# Patient Record
Sex: Male | Born: 1960 | Hispanic: No | Marital: Married | Smoking: Never smoker
Health system: Southern US, Community
[De-identification: ages and names within clinical notes are randomized; demographics above are authoritative.]

## PROBLEM LIST (undated history)

## (undated) DIAGNOSIS — I872 Venous insufficiency (chronic) (peripheral): Secondary | ICD-10-CM

## (undated) DIAGNOSIS — K5792 Diverticulitis of intestine, part unspecified, without perforation or abscess without bleeding: Secondary | ICD-10-CM

## (undated) DIAGNOSIS — I10 Essential (primary) hypertension: Secondary | ICD-10-CM

## (undated) DIAGNOSIS — H35033 Hypertensive retinopathy, bilateral: Secondary | ICD-10-CM

## (undated) DIAGNOSIS — H409 Unspecified glaucoma: Secondary | ICD-10-CM

## (undated) DIAGNOSIS — M19011 Primary osteoarthritis, right shoulder: Secondary | ICD-10-CM

## (undated) DIAGNOSIS — E113299 Type 2 diabetes mellitus with mild nonproliferative diabetic retinopathy without macular edema, unspecified eye: Secondary | ICD-10-CM

## (undated) DIAGNOSIS — F39 Unspecified mood [affective] disorder: Secondary | ICD-10-CM

## (undated) DIAGNOSIS — E781 Pure hyperglyceridemia: Secondary | ICD-10-CM

## (undated) DIAGNOSIS — E669 Obesity, unspecified: Secondary | ICD-10-CM

## (undated) DIAGNOSIS — H269 Unspecified cataract: Secondary | ICD-10-CM

## (undated) DIAGNOSIS — K5790 Diverticulosis of intestine, part unspecified, without perforation or abscess without bleeding: Secondary | ICD-10-CM

## (undated) DIAGNOSIS — F411 Generalized anxiety disorder: Secondary | ICD-10-CM

## (undated) DIAGNOSIS — M7581 Other shoulder lesions, right shoulder: Secondary | ICD-10-CM

## (undated) DIAGNOSIS — E66811 Obesity, class 1: Secondary | ICD-10-CM

## (undated) HISTORY — DX: Type 2 diabetes mellitus with mild nonproliferative diabetic retinopathy without macular edema, unspecified eye: E11.3299

## (undated) HISTORY — PX: COLONOSCOPY: SHX174

## (undated) HISTORY — DX: Obesity, class 1: E66.811

## (undated) HISTORY — DX: Pure hyperglyceridemia: E78.1

## (undated) HISTORY — DX: Unspecified glaucoma: H40.9

## (undated) HISTORY — DX: Diverticulitis of intestine, part unspecified, without perforation or abscess without bleeding: K57.92

## (undated) HISTORY — DX: Unspecified cataract: H26.9

## (undated) HISTORY — DX: Diverticulosis of intestine, part unspecified, without perforation or abscess without bleeding: K57.90

## (undated) HISTORY — DX: Primary osteoarthritis, right shoulder: M19.011

## (undated) HISTORY — DX: Essential (primary) hypertension: I10

## (undated) HISTORY — DX: Generalized anxiety disorder: F41.1

## (undated) HISTORY — DX: Unspecified mood (affective) disorder: F39

## (undated) HISTORY — DX: Obesity, unspecified: E66.9

## (undated) HISTORY — DX: Hypertensive retinopathy, bilateral: H35.033

## (undated) HISTORY — DX: Other shoulder lesions, right shoulder: M75.81

## (undated) HISTORY — DX: Venous insufficiency (chronic) (peripheral): I87.2

## (undated) HISTORY — PX: HERNIA REPAIR: SHX51

---
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
## 1983-02-05 HISTORY — PX: ANTERIOR CRUCIATE LIGAMENT REPAIR: SHX115

## 1997-02-04 HISTORY — PX: LUMBAR DISC SURGERY: SHX700

## 2009-02-04 HISTORY — PX: COLONOSCOPY: SHX174

## 2009-05-05 LAB — HM COLONOSCOPY

## 2012-09-25 ENCOUNTER — Ambulatory Visit: Payer: Self-pay | Admitting: Internal Medicine

## 2012-09-28 ENCOUNTER — Ambulatory Visit (INDEPENDENT_AMBULATORY_CARE_PROVIDER_SITE_OTHER): Payer: Federal, State, Local not specified - PPO | Admitting: Family Medicine

## 2012-09-28 ENCOUNTER — Encounter: Payer: Self-pay | Admitting: Family Medicine

## 2012-09-28 VITALS — BP 164/98 | HR 87 | Temp 99.1°F | Resp 16 | Ht 79.0 in | Wt 275.0 lb

## 2012-09-28 DIAGNOSIS — I10 Essential (primary) hypertension: Secondary | ICD-10-CM | POA: Insufficient documentation

## 2012-09-28 DIAGNOSIS — E119 Type 2 diabetes mellitus without complications: Secondary | ICD-10-CM

## 2012-09-28 DIAGNOSIS — E118 Type 2 diabetes mellitus with unspecified complications: Secondary | ICD-10-CM | POA: Insufficient documentation

## 2012-09-28 LAB — BASIC METABOLIC PANEL
CO2: 28 mEq/L (ref 19–32)
Chloride: 102 mEq/L (ref 96–112)
Creatinine, Ser: 0.9 mg/dL (ref 0.4–1.5)
Potassium: 4.2 mEq/L (ref 3.5–5.1)

## 2012-09-28 LAB — HEMOGLOBIN A1C: Hgb A1c MFr Bld: 6.6 % — ABNORMAL HIGH (ref 4.6–6.5)

## 2012-09-28 LAB — TSH: TSH: 0.52 u[IU]/mL (ref 0.35–5.50)

## 2012-09-28 MED ORDER — METOPROLOL SUCCINATE ER 25 MG PO TB24: 25.0000 mg | ORAL_TABLET | Freq: Every day | ORAL | Status: DC

## 2012-09-28 NOTE — Assessment & Plan Note (Addendum)
Add toprol XL 25mg  qd.  Therapeutic expectations and side effect profile of medication discussed today.  Patient's questions answered. Continue amlodipine 10mg  qd and irbesartan 300 mg qd. Check BMET today. Buy bp cuff and monitor at home 1-2 times a day until next f/u in 10d.

## 2012-09-28 NOTE — Assessment & Plan Note (Signed)
Check HbA1c today. This will be his first A1c since getting started on metformin per pt report.

## 2012-09-28 NOTE — Patient Instructions (Signed)
Buy electronic BP cuff (upper arm cuff) and check bp 1-2 times per day.

## 2012-09-28 NOTE — Progress Notes (Signed)
Office Note 09/28/2012  CC:  Chief Complaint  Patient presents with  . Establish Care  . Hypertension    HPI:  Brett Cruz is a 52 y.o. White male who is here to est care, discuss HTN. Patient's most recent primary MD: Casilda Carls, Desiree Hane. Old records were not reviewed prior to or during today's visit.  Recent elevated bp while in [place] visiting, had HA, felt tired, had some intermittent CP's : went to ED where he "got it all checked out" --describes cardiac enzymes, head CT, etc-normal. Amlodipine 10 mg added to his irbisartan 300mg  qd he was already on and he feels a moderate amont better--still some band of HA pain across forehead and extending down behind left ear to occipital area.   He was rx'd fioricet by the ED for HA treatment and says he has used it only a couple of times and thinks it helped when he did take it.   No blurry vision, no focal or generalized weakness, no chest pain, no SOB, no palpitations, no swelling of extremities, no PND or orthopnea.   Past Medical History  Diagnosis Date  . Hypertension approx 2010  . Diabetes mellitus without complication 2013/14  . Diverticulitis   . High triglycerides     Past Surgical History  Procedure Laterality Date  . Back surgery  1999  . Anterior cruciate ligament repair  1985  . Tonsilectomy, adenoidectomy, bilateral myringotomy and tubes  1966    Family History  Problem Relation Age of Onset  . Cancer Mother   . Cancer Father   . Heart disease Father   . Heart disease Brother     History   Social History  . Marital Status: Married    Spouse Name: N/A    Number of Children: N/A  . Years of Education: N/A   Occupational History  . Not on file.   Social History Main Topics  . Smoking status: Never Smoker   . Smokeless tobacco: Never Used  . Alcohol Use: Yes     Comment: socially  . Drug Use: No  . Sexual Activity: Not on file   Other Topics Concern  . Not on file   Social History Narrative    Married, 2 college age sons.   Orig from [place].   Relocated to [facility] [place].   Occupation: Airline pilot for [employer].   BA from [employer].   No tobacco or drugs.  Alcohol: occasional.    Outpatient Encounter Prescriptions as of 09/28/2012  Medication Sig Dispense Refill  . amLODipine (NORVASC) 10 MG tablet Take 10 mg by mouth daily.       . butalbital-acetaminophen-caffeine (FIORICET, ESGIC) 50-325-40 MG per tablet Take 2 tablets by mouth every 6 (six) hours as needed.       . fenofibrate (TRICOR) 145 MG tablet Take 145 mg by mouth daily.       . irbesartan (AVAPRO) 300 MG tablet Take 300 mg by mouth daily.       . metFORMIN (GLUCOPHAGE-XR) 500 MG 24 hr tablet Take 1,000 mg by mouth daily with breakfast.       . metoprolol succinate (TOPROL-XL) 25 MG 24 hr tablet Take 1 tablet (25 mg total) by mouth daily.  30 tablet  1   No facility-administered encounter medications on file as of 09/28/2012.    No Known Allergies  ROS See HPI PE; Blood pressure 164/98, pulse 87, temperature 99.1 F (37.3 C), temperature source Temporal, resp. rate 16, height  6\' 7"  (2.007 m), weight 275 lb (124.739 kg), SpO2 97.00%. Gen: Alert, well appearing.  Patient is oriented to person, place, time, and situation. AFFECT: pleasant, lucid thought and speech. ENT:  Eyes: no injection, icteris, swelling, or exudate.  EOMI, PERRLA. Nose: no drainage or turbinate edema/swelling.  No injection or focal lesion.  Mouth: lips without lesion/swelling.  Oral mucosa pink and moist.  Oropharynx without erythema, exudate, or swelling.  Neck: supple/nontender.  No LAD, mass, or TM.  Carotid pulses 2+ bilaterally, without bruits. CV: RRR, no m/r/g.   LUNGS: CTA bilat, nonlabored resps, good aeration in all lung fields. ABD: soft, NT, ND, BS normal.  No hepatospenomegaly or mass.  No bruits. EXT: no clubbing, cyanosis, or edema.   Pertinent labs:  None today  ASSESSMENT AND PLAN:   New pt: obtain old  records.  Uncontrolled hypertension Add toprol XL 25mg  qd.  Therapeutic expectations and side effect profile of medication discussed today.  Patient's questions answered. Continue amlodipine 10mg  qd and irbesartan 300 mg qd. Check BMET today. Buy bp cuff and monitor at home 1-2 times a day until next f/u in 10d.  Type II or unspecified type diabetes mellitus without mention of complication, not stated as uncontrolled Check HbA1c today. This will be his first A1c since getting started on metformin per pt report.   An After Visit Summary was printed and given to the patient.  Return in about 10 days (around 10/08/2012) for 30 min f/u for HTN.

## 2012-10-08 ENCOUNTER — Ambulatory Visit (INDEPENDENT_AMBULATORY_CARE_PROVIDER_SITE_OTHER): Payer: Federal, State, Local not specified - PPO | Admitting: Family Medicine

## 2012-10-08 ENCOUNTER — Encounter: Payer: Self-pay | Admitting: Family Medicine

## 2012-10-08 VITALS — BP 134/88 | HR 70 | Temp 98.8°F | Resp 16 | Ht 79.0 in | Wt 278.0 lb

## 2012-10-08 DIAGNOSIS — I10 Essential (primary) hypertension: Secondary | ICD-10-CM

## 2012-10-08 DIAGNOSIS — E119 Type 2 diabetes mellitus without complications: Secondary | ICD-10-CM

## 2012-10-08 NOTE — Progress Notes (Signed)
OFFICE NOTE  10/08/2012  CC:  Chief Complaint  Patient presents with  . Hypertension     HPI: Patient is a 52 y.o. Caucasian male who is here for 10 day f/u for uncontrolled HTN. Started him on 25mg  toprol XL last visit.  Labs showed A1c <7%, normal BMET.  He is no longer having any HAs.  Denies any side effect from toprol or any other of his meds.  Pertinent PMH:  Past Medical History  Diagnosis Date  . Hypertension approx 2010  . Diabetes mellitus without complication 2013/14  . Diverticulitis   . High triglycerides    Past surgical, social, and family history reviewed and no changes noted since last office visit.  MEDS:  Outpatient Prescriptions Prior to Visit  Medication Sig Dispense Refill  . amLODipine (NORVASC) 10 MG tablet Take 10 mg by mouth daily.       . fenofibrate (TRICOR) 145 MG tablet Take 145 mg by mouth daily.       . irbesartan (AVAPRO) 300 MG tablet Take 300 mg by mouth daily.       . metFORMIN (GLUCOPHAGE-XR) 500 MG 24 hr tablet Take 1,000 mg by mouth daily with breakfast.       . metoprolol succinate (TOPROL-XL) 25 MG 24 hr tablet Take 1 tablet (25 mg total) by mouth daily.  30 tablet  1  . butalbital-acetaminophen-caffeine (FIORICET, ESGIC) 50-325-40 MG per tablet Take 2 tablets by mouth every 6 (six) hours as needed.        No facility-administered medications prior to visit.    PE: Blood pressure 134/88, pulse 70, temperature 98.8 F (37.1 C), temperature source Temporal, resp. rate 16, height 6\' 7"  (2.007 m), weight 278 lb (126.1 kg), SpO2 97.00%. Gen: Alert, well appearing.  Patient is oriented to person, place, time, and situation. CV: RRR, no m/r/g.   LUNGS: CTA bilat, nonlabored resps, good aeration in all lung fields. EXT: trace pitting edema in right LL, 1+ pitting edema in right LL  IMPRESSION AND PLAN:  Uncontrolled hypertension Now under control. Continue current meds and home  bp monitoring.  Type II or unspecified type diabetes  mellitus without mention of complication, not stated as uncontrolled Good control. Lab Results  Component Value Date   HGBA1C 6.6* 09/28/2012   Continue current med and diet.   This is a new patient, 2nd visit today, still haven't received old records.  He declined flu vaccine today.  FOLLOW UP: 4 mo-fasting

## 2012-10-08 NOTE — Assessment & Plan Note (Signed)
Now under control. Continue current meds and home  bp monitoring.

## 2012-10-08 NOTE — Assessment & Plan Note (Signed)
Good control. Lab Results  Component Value Date   HGBA1C 6.6* 09/28/2012   Continue current med and diet.

## 2012-10-14 ENCOUNTER — Telehealth: Payer: Self-pay | Admitting: Family Medicine

## 2012-10-14 MED ORDER — AMLODIPINE BESYLATE 10 MG PO TABS: 10.0000 mg | ORAL_TABLET | Freq: Every day | ORAL | Status: DC

## 2012-10-14 MED ORDER — METOPROLOL SUCCINATE ER 25 MG PO TB24: 25.0000 mg | ORAL_TABLET | Freq: Every day | ORAL | Status: DC

## 2012-10-14 NOTE — Telephone Encounter (Signed)
Patient's wife, Darel Hong called asking for rx refills of metoprolol and amlodipine for patient to be sent to [facility], [place].

## 2012-11-03 ENCOUNTER — Encounter: Payer: Self-pay | Admitting: Family Medicine

## 2012-11-03 DIAGNOSIS — Z125 Encounter for screening for malignant neoplasm of prostate: Secondary | ICD-10-CM | POA: Insufficient documentation

## 2012-11-03 DIAGNOSIS — Z1211 Encounter for screening for malignant neoplasm of colon: Secondary | ICD-10-CM | POA: Insufficient documentation

## 2013-01-12 ENCOUNTER — Ambulatory Visit (INDEPENDENT_AMBULATORY_CARE_PROVIDER_SITE_OTHER): Payer: Federal, State, Local not specified - PPO | Admitting: Family Medicine

## 2013-01-12 ENCOUNTER — Ambulatory Visit (HOSPITAL_BASED_OUTPATIENT_CLINIC_OR_DEPARTMENT_OTHER)
Admission: RE | Admit: 2013-01-12 | Discharge: 2013-01-12 | Disposition: A | Discharge status: Home / Self Care | Payer: Federal, State, Local not specified - PPO | Source: Ambulatory Visit | Attending: Family Medicine | Admitting: Family Medicine

## 2013-01-12 ENCOUNTER — Encounter: Payer: Self-pay | Admitting: Family Medicine

## 2013-01-12 VITALS — BP 150/91 | HR 75 | Temp 97.6°F | Resp 18 | Ht 79.0 in | Wt 292.0 lb

## 2013-01-12 DIAGNOSIS — M79609 Pain in unspecified limb: Secondary | ICD-10-CM

## 2013-01-12 DIAGNOSIS — M7989 Other specified soft tissue disorders: Secondary | ICD-10-CM | POA: Insufficient documentation

## 2013-01-12 DIAGNOSIS — I872 Venous insufficiency (chronic) (peripheral): Secondary | ICD-10-CM

## 2013-01-12 DIAGNOSIS — R609 Edema, unspecified: Secondary | ICD-10-CM

## 2013-01-12 NOTE — Progress Notes (Signed)
Pre-visit discussion using our clinic review tool. No additional management support is needed unless otherwise documented below in the visit note.  OFFICE NOTE  01/12/2013  CC:  Chief Complaint  Patient presents with  . Leg Swelling    mainly Left leg.   . Leg Pain     HPI: Patient is a 52 y.o. Caucasian male who is here for acute visit for leg swelling. Onset about 3 wks ago of lower leg pain--anteriorly under patella and also in calf---left>right.  The pain is intermittent, worse with wt bearing but at times it is fine with wt bearing.  No knee swelling. Swelling worse towards the end of his day.  He admits to eating quite a bit more liberally lately. No known FH of blood clot. No recent prolonged immobilization, no recent surgical procedures.  +Hx of back pain but has not had radicular pain similar to his current pain before.   Pertinent PMH:  Past Medical History  Diagnosis Date  . Hypertension approx 2010  . Diabetes mellitus without complication 2013/14  . Diverticulitis   . High triglycerides   . Mood disorder     dep/anx  . Obesity, Class I, BMI 30-34.9     MEDS:  Outpatient Prescriptions Prior to Visit  Medication Sig Dispense Refill  . amLODipine (NORVASC) 10 MG tablet Take 1 tablet (10 mg total) by mouth daily.  30 tablet  3  . fenofibrate (TRICOR) 145 MG tablet Take 145 mg by mouth daily.       . irbesartan (AVAPRO) 300 MG tablet Take 300 mg by mouth daily.       . metFORMIN (GLUCOPHAGE-XR) 500 MG 24 hr tablet Take 1,000 mg by mouth daily with breakfast.       . metoprolol succinate (TOPROL-XL) 25 MG 24 hr tablet Take 1 tablet (25 mg total) by mouth daily.  30 tablet  3  . butalbital-acetaminophen-caffeine (FIORICET, ESGIC) 50-325-40 MG per tablet Take 2 tablets by mouth every 6 (six) hours as needed.        No facility-administered medications prior to visit.    PE: Blood pressure 150/91, pulse 75, temperature 97.6 F (36.4 C), temperature source  Temporal, resp. rate 18, height 6\' 7"  (2.007 m), weight 292 lb (132.45 kg), SpO2 96.00%. Gen: Alert, well appearing.  Patient is oriented to person, place, time, and situation. CV: RRR, no m/r/g.   LUNGS: CTA bilat, nonlabored resps, good aeration in all lung fields. EXT: no cyanosis or clubbing. He has 2+ pitting edema in both LE's from the knees down into ankles/feet. No knee warmth or swelling or redness.  LE strength 5/5 bilat.  DTRs trace in patellar and achilles region bilat.  Sensation intact.  NO calf tenderness or cord. Calf cir 15 cm below patella is 43 1/2 cm on left and 44 cm on right.  IMPRESSION AND PLAN:  Bilat LE edema, L>R.  With some L>R pain assoc with this. Will r/o DVT with u/s today. Discussed likelihood of Na over-ingestion lately with "holiday" eating and pt expressed understanding. Gave pt low Na diet handout.  FOLLOW UP: To be determined based on results of pending workup.

## 2013-01-18 ENCOUNTER — Telehealth: Payer: Self-pay | Admitting: Family Medicine

## 2013-01-18 MED ORDER — IRBESARTAN 300 MG PO TABS: 300.0000 mg | ORAL_TABLET | Freq: Every day | ORAL | Status: DC

## 2013-01-18 NOTE — Telephone Encounter (Signed)
Medication sent to pharmacy  

## 2013-02-09 ENCOUNTER — Encounter: Payer: Self-pay | Admitting: Family Medicine

## 2013-02-09 ENCOUNTER — Ambulatory Visit (INDEPENDENT_AMBULATORY_CARE_PROVIDER_SITE_OTHER): Payer: Federal, State, Local not specified - PPO | Admitting: Family Medicine

## 2013-02-09 VITALS — BP 142/88 | HR 69 | Temp 98.1°F | Resp 18 | Ht 79.0 in | Wt 286.0 lb

## 2013-02-09 DIAGNOSIS — E781 Pure hyperglyceridemia: Secondary | ICD-10-CM | POA: Insufficient documentation

## 2013-02-09 DIAGNOSIS — I10 Essential (primary) hypertension: Secondary | ICD-10-CM

## 2013-02-09 DIAGNOSIS — I872 Venous insufficiency (chronic) (peripheral): Secondary | ICD-10-CM

## 2013-02-09 DIAGNOSIS — Z23 Encounter for immunization: Secondary | ICD-10-CM

## 2013-02-09 DIAGNOSIS — E119 Type 2 diabetes mellitus without complications: Secondary | ICD-10-CM

## 2013-02-09 MED ORDER — METFORMIN HCL ER 500 MG PO TB24: 1000.0000 mg | ORAL_TABLET | Freq: Every day | ORAL | Status: DC

## 2013-02-09 MED ORDER — IRBESARTAN 300 MG PO TABS: 300.0000 mg | ORAL_TABLET | Freq: Every day | ORAL | Status: DC

## 2013-02-09 MED ORDER — FENOFIBRATE 145 MG PO TABS: 145.0000 mg | ORAL_TABLET | Freq: Every day | ORAL | Status: DC

## 2013-02-09 MED ORDER — METOPROLOL SUCCINATE ER 25 MG PO TB24: 25.0000 mg | ORAL_TABLET | Freq: Every day | ORAL | Status: DC

## 2013-02-09 MED ORDER — AMLODIPINE BESYLATE 10 MG PO TABS: 10.0000 mg | ORAL_TABLET | Freq: Every day | ORAL | Status: DC

## 2013-02-09 NOTE — Assessment & Plan Note (Signed)
Improving with low Na diet.

## 2013-02-09 NOTE — Assessment & Plan Note (Addendum)
No home monitoring: encouraged pt again to get glucometer, bring it in for nurse visit for teaching. Diet ok. Foot exam normal today. Pneumovax today.  He declined flu vaccine. Labs + urine microalb/cr next f/u in 4 mo.

## 2013-02-09 NOTE — Assessment & Plan Note (Signed)
Compliant with meds, diet ok. Recheck labs next f/u in 4 mo.

## 2013-02-09 NOTE — Assessment & Plan Note (Signed)
Control much improved. The current medical regimen is effective;  continue present plan and medications.

## 2013-02-09 NOTE — Progress Notes (Signed)
OFFICE NOTE  02/09/2013  CC:  Chief Complaint  Patient presents with  . Follow-up     HPI: Patient is a 53 y.o. Caucasian male who is here for 4 mo f/u DM2, HTN, hypertriglyceridemia. Compliant with all chronic meds.  No polyuria or polydipsia. No burning, tingling, or numbness in feet.  LE swelling better lately, cutting back on salt.  Not checking glucoses lately--hasn't bought a machine. BP numbers normal at home, esp when he took a day off recently.  Diet good for long stretches and then a stretch of poorer diet during holidays. He prefers to wait and recheck labs next f/u if that's ok w/me. Last lipid panel 06/2012 stable.   Pertinent PMH:  Past Medical History  Diagnosis Date  . Hypertension approx 2010  . Diabetes mellitus without complication 6712/45  . Diverticulitis   . High triglycerides   . Mood disorder     dep/anx  . Obesity, Class I, BMI 30-34.9    Past surgical, social, and family history reviewed and no changes noted since last office visit.  MEDS:  Outpatient Prescriptions Prior to Visit  Medication Sig Dispense Refill  . amLODipine (NORVASC) 10 MG tablet Take 1 tablet (10 mg total) by mouth daily.  30 tablet  3  . fenofibrate (TRICOR) 145 MG tablet Take 145 mg by mouth daily.       . irbesartan (AVAPRO) 300 MG tablet Take 1 tablet (300 mg total) by mouth daily.  30 tablet  3  . metFORMIN (GLUCOPHAGE-XR) 500 MG 24 hr tablet Take 1,000 mg by mouth daily with breakfast.       . metoprolol succinate (TOPROL-XL) 25 MG 24 hr tablet Take 1 tablet (25 mg total) by mouth daily.  30 tablet  3  . butalbital-acetaminophen-caffeine (FIORICET, ESGIC) 50-325-40 MG per tablet Take 2 tablets by mouth every 6 (six) hours as needed.        No facility-administered medications prior to visit.    PE: Blood pressure 142/88, pulse 69, temperature 98.1 F (36.7 C), temperature source Temporal, resp. rate 18, height 6\' 7"  (2.007 m), weight 286 lb (129.729 kg), SpO2  99.00%. Gen: Alert, well appearing.  Patient is oriented to person, place, time, and situation. Foot exam - both normal; no swelling, tenderness or skin or vascular lesions. Color and temperature is normal. Sensation is intact. Peripheral pulses are palpable. Toenails are normal. LEGS: pretibial pitting edema 1+ bilat   IMPRESSION AND PLAN:  Type II or unspecified type diabetes mellitus without mention of complication, not stated as uncontrolled No home monitoring: encouraged pt again to get glucometer, bring it in for nurse visit for teaching. Diet ok. Foot exam normal today. Pneumovax today.  He declined flu vaccine. Labs + urine microalb/cr next f/u in 4 mo.  Uncontrolled hypertension Control much improved. The current medical regimen is effective;  continue present plan and medications.   Chronic venous insufficiency Improving with low Na diet.  Hypertriglyceridemia Compliant with meds, diet ok. Recheck labs next f/u in 4 mo.   An After Visit Summary was printed and given to the patient.  FOLLOW UP: 25mo

## 2013-02-09 NOTE — Progress Notes (Signed)
Pre visit review using our clinic review tool, if applicable. No additional management support is needed unless otherwise documented below in the visit note. 

## 2013-02-10 ENCOUNTER — Telehealth: Payer: Self-pay

## 2013-02-10 NOTE — Telephone Encounter (Signed)
Relevant patient education mailed to patient.  

## 2013-03-05 ENCOUNTER — Telehealth: Payer: Self-pay | Admitting: Family Medicine

## 2013-03-05 NOTE — Telephone Encounter (Signed)
Relevant patient education mailed to patient.  

## 2013-04-29 ENCOUNTER — Other Ambulatory Visit: Payer: Self-pay

## 2013-04-29 ENCOUNTER — Other Ambulatory Visit: Payer: Self-pay | Admitting: Family Medicine

## 2013-04-29 MED ORDER — GLUCOSE BLOOD VI STRP: ORAL_STRIP | Status: AC

## 2013-04-29 NOTE — Telephone Encounter (Signed)
Pt requesting strips for accu check compact plus b/s meter.  Strips sent to [facility] per pt request.

## 2013-06-10 ENCOUNTER — Ambulatory Visit: Payer: Federal, State, Local not specified - PPO | Admitting: Family Medicine

## 2013-06-15 ENCOUNTER — Other Ambulatory Visit (INDEPENDENT_AMBULATORY_CARE_PROVIDER_SITE_OTHER): Payer: Federal, State, Local not specified - PPO

## 2013-06-15 DIAGNOSIS — Z0000 Encounter for general adult medical examination without abnormal findings: Secondary | ICD-10-CM

## 2013-06-15 LAB — LIPID PANEL
CHOL/HDL RATIO: 4
Cholesterol: 117 mg/dL (ref 0–200)
HDL: 30.5 mg/dL — AB (ref >39.00)
LDL Cholesterol: 53 mg/dL (ref 0–99)
Triglycerides: 168 mg/dL — ABNORMAL HIGH (ref 0.0–149.0)
VLDL: 33.6 mg/dL (ref 0.0–40.0)

## 2013-06-15 LAB — CBC WITH DIFFERENTIAL/PLATELET
BASOS ABS: 0.1 10*3/uL (ref 0.0–0.1)
Basophils Relative: 0.7 % (ref 0.0–3.0)
EOS PCT: 2.5 % (ref 0.0–5.0)
Eosinophils Absolute: 0.2 10*3/uL (ref 0.0–0.7)
HCT: 44 % (ref 39.0–52.0)
Hemoglobin: 15 g/dL (ref 13.0–17.0)
LYMPHS PCT: 40.4 % (ref 12.0–46.0)
Lymphs Abs: 3 10*3/uL (ref 0.7–4.0)
MCHC: 34.1 g/dL (ref 30.0–36.0)
MCV: 91.9 fl (ref 78.0–100.0)
Monocytes Absolute: 0.5 10*3/uL (ref 0.1–1.0)
Monocytes Relative: 7.3 % (ref 3.0–12.0)
Neutro Abs: 3.6 10*3/uL (ref 1.4–7.7)
Neutrophils Relative %: 49.1 % (ref 43.0–77.0)
PLATELETS: 254 10*3/uL (ref 150.0–400.0)
RBC: 4.79 Mil/uL (ref 4.22–5.81)
RDW: 13.1 % (ref 11.5–15.5)
WBC: 7.3 10*3/uL (ref 4.0–10.5)

## 2013-06-15 LAB — COMPREHENSIVE METABOLIC PANEL
ALBUMIN: 4 g/dL (ref 3.5–5.2)
ALT: 36 U/L (ref 0–53)
AST: 27 U/L (ref 0–37)
Alkaline Phosphatase: 61 U/L (ref 39–117)
BUN: 16 mg/dL (ref 6–23)
CALCIUM: 9.4 mg/dL (ref 8.4–10.5)
CHLORIDE: 105 meq/L (ref 96–112)
CO2: 26 meq/L (ref 19–32)
Creatinine, Ser: 0.7 mg/dL (ref 0.4–1.5)
GFR: 126.85 mL/min (ref >60.00)
Glucose, Bld: 146 mg/dL — ABNORMAL HIGH (ref 70–99)
POTASSIUM: 4.1 meq/L (ref 3.5–5.1)
SODIUM: 138 meq/L (ref 135–145)
TOTAL PROTEIN: 6.9 g/dL (ref 6.0–8.3)
Total Bilirubin: 0.5 mg/dL (ref 0.2–1.2)

## 2013-06-15 LAB — PSA: PSA: 0.68 ng/mL (ref 0.10–4.00)

## 2013-06-15 LAB — TSH: TSH: 2.27 u[IU]/mL (ref 0.35–4.50)

## 2013-06-15 LAB — HEMOGLOBIN A1C: HEMOGLOBIN A1C: 7.2 % — AB (ref 4.6–6.5)

## 2013-06-16 ENCOUNTER — Encounter: Payer: Self-pay | Admitting: Family Medicine

## 2013-06-16 ENCOUNTER — Ambulatory Visit (INDEPENDENT_AMBULATORY_CARE_PROVIDER_SITE_OTHER): Payer: Federal, State, Local not specified - PPO | Admitting: Family Medicine

## 2013-06-16 VITALS — BP 137/87 | HR 81 | Temp 97.8°F | Resp 18 | Ht 79.0 in | Wt 288.0 lb

## 2013-06-16 DIAGNOSIS — IMO0001 Reserved for inherently not codable concepts without codable children: Secondary | ICD-10-CM | POA: Insufficient documentation

## 2013-06-16 DIAGNOSIS — Z23 Encounter for immunization: Secondary | ICD-10-CM

## 2013-06-16 DIAGNOSIS — Z135 Encounter for screening for eye and ear disorders: Secondary | ICD-10-CM

## 2013-06-16 DIAGNOSIS — I10 Essential (primary) hypertension: Secondary | ICD-10-CM

## 2013-06-16 DIAGNOSIS — Z0000 Encounter for general adult medical examination without abnormal findings: Secondary | ICD-10-CM | POA: Insufficient documentation

## 2013-06-16 DIAGNOSIS — E1165 Type 2 diabetes mellitus with hyperglycemia: Principal | ICD-10-CM

## 2013-06-16 DIAGNOSIS — E781 Pure hyperglyceridemia: Secondary | ICD-10-CM

## 2013-06-16 DIAGNOSIS — Z1211 Encounter for screening for malignant neoplasm of colon: Secondary | ICD-10-CM

## 2013-06-16 LAB — NICOTINE/COTININE METABOLITES

## 2013-06-16 NOTE — Patient Instructions (Signed)
Check your sugar once daily (do a fasting check every other day alternating with a 2 hour "after meal" check every other day). Record these and bring them to your next visit in 4 mo.  

## 2013-06-16 NOTE — Progress Notes (Signed)
OFFICE NOTE  06/16/2013  CC:  Chief Complaint  Patient presents with  . Follow-up     HPI: Patient is a 53 y.o. Caucasian male who is here for 4 mo f/u DM 2, HTN, hypertriglyceridemia. Has glucometer now but hasn't used it. Pt says he is motivated now to get started with TLC, wants to avoid any changes in meds/additional meds at this time.  Home bp monitoring shows good control per pt. We reviewed all of his recent fasting blood work.  Pertinent PMH:  Past medical, surgical, social, and family history reviewed and no changes are noted since last office visit.  MEDS:  Outpatient Prescriptions Prior to Visit  Medication Sig Dispense Refill  . amLODipine (NORVASC) 10 MG tablet Take 1 tablet (10 mg total) by mouth daily.  90 tablet  1  . butalbital-acetaminophen-caffeine (FIORICET, ESGIC) 50-325-40 MG per tablet Take 2 tablets by mouth every 6 (six) hours as needed.       . fenofibrate (TRICOR) 145 MG tablet Take 1 tablet (145 mg total) by mouth daily.  90 tablet  1  . glucose blood test strip Use as instructed  100 each  12  . irbesartan (AVAPRO) 300 MG tablet Take 1 tablet (300 mg total) by mouth daily.  90 tablet  1  . metFORMIN (GLUCOPHAGE-XR) 500 MG 24 hr tablet Take 2 tablets (1,000 mg total) by mouth daily with breakfast.  180 tablet  1  . metoprolol succinate (TOPROL-XL) 25 MG 24 hr tablet Take 1 tablet (25 mg total) by mouth daily.  90 tablet  1   No facility-administered medications prior to visit.    PE: Blood pressure 137/87, pulse 81, temperature 97.8 F (36.6 C), temperature source Temporal, resp. rate 18, height 6\' 7"  (2.007 m), weight 288 lb (130.636 kg), SpO2 98.00%. Gen: Alert, well appearing.  Patient is oriented to person, place, time, and situation. AFFECT: pleasant, lucid thought and speech. No further exam.  LAB: none today Recent: Lab Results  Component Value Date   TSH 2.27 06/15/2013   Lab Results  Component Value Date   WBC 7.3 06/15/2013   HGB  15.0 06/15/2013   HCT 44.0 06/15/2013   MCV 91.9 06/15/2013   PLT 254.0 06/15/2013   Lab Results  Component Value Date   CREATININE 0.7 06/15/2013   BUN 16 06/15/2013   NA 138 06/15/2013   K 4.1 06/15/2013   CL 105 06/15/2013   CO2 26 06/15/2013   Lab Results  Component Value Date   ALT 36 06/15/2013   AST 27 06/15/2013   ALKPHOS 61 06/15/2013   BILITOT 0.5 06/15/2013   Lab Results  Component Value Date   CHOL 117 06/15/2013   Lab Results  Component Value Date   HDL 30.50* 06/15/2013   Lab Results  Component Value Date   LDLCALC 53 06/15/2013   Lab Results  Component Value Date   TRIG 168.0* 06/15/2013   Lab Results  Component Value Date   CHOLHDL 4 06/15/2013   Lab Results  Component Value Date   PSA 0.68 06/15/2013   Lab Results  Component Value Date   HGBA1C 7.2* 06/15/2013   PSA 0.68 on 06/16/14   IMPRESSION AND PLAN:  Type II or unspecified type diabetes mellitus without mention of complication, uncontrolled Discussed possible increase in metformin today but pt really wants to give TLC a fighting try. I agreed that this was fine. Nurse educated pt in technique for checking blood sugar today.  Instructions: Check  your sugar once daily (do a fasting check every other day alternating with a 2 hour "after meal" check every other day).  Record these and bring them to your next visit in 4 mo.  Urine microalb/cr today. Referred pt to ophthal (Dr. Zigmund Daniel) for diab retpthy screening.   HTN (hypertension) The current medical regimen is effective;  continue present plan and medications.   Hypertriglyceridemia And low HDL. Stable compared to check 1 yr ago (old records). TLC and continue tricor 145mg  qd.  Preventative health care Tdap today.   An After Visit Summary was printed and given to the patient.  FOLLOW UP: 4 mo

## 2013-06-16 NOTE — Assessment & Plan Note (Signed)
Tdap today

## 2013-06-16 NOTE — Assessment & Plan Note (Signed)
The current medical regimen is effective;  continue present plan and medications.  

## 2013-06-16 NOTE — Progress Notes (Signed)
Pre visit review using our clinic review tool, if applicable. No additional management support is needed unless otherwise documented below in the visit note. 

## 2013-06-16 NOTE — Assessment & Plan Note (Addendum)
Discussed possible increase in metformin today but pt really wants to give TLC a fighting try. I agreed that this was fine. Nurse educated pt in technique for checking blood sugar today.  Instructions: Check your sugar once daily (do a fasting check every other day alternating with a 2 hour "after meal" check every other day).  Record these and bring them to your next visit in 4 mo.  Urine microalb/cr today. Referred pt to ophthal (Dr. Zigmund Daniel) for diab retpthy screening.

## 2013-06-16 NOTE — Assessment & Plan Note (Signed)
And low HDL. Stable compared to check 1 yr ago (old records). TLC and continue tricor 145mg  qd.

## 2013-06-18 LAB — MICROALBUMIN / CREATININE URINE RATIO
Creatinine,U: 144.2 mg/dL
MICROALB UR: 1.9 mg/dL (ref 0.0–1.9)
Microalb Creat Ratio: 1.3 mg/g (ref 0.0–30.0)

## 2013-07-05 ENCOUNTER — Encounter (INDEPENDENT_AMBULATORY_CARE_PROVIDER_SITE_OTHER): Payer: Self-pay | Admitting: Ophthalmology

## 2013-07-07 ENCOUNTER — Encounter (INDEPENDENT_AMBULATORY_CARE_PROVIDER_SITE_OTHER): Payer: Federal, State, Local not specified - PPO | Admitting: Ophthalmology

## 2013-07-07 DIAGNOSIS — H33309 Unspecified retinal break, unspecified eye: Secondary | ICD-10-CM

## 2013-07-07 DIAGNOSIS — H43819 Vitreous degeneration, unspecified eye: Secondary | ICD-10-CM

## 2013-07-07 DIAGNOSIS — E11319 Type 2 diabetes mellitus with unspecified diabetic retinopathy without macular edema: Secondary | ICD-10-CM

## 2013-07-07 DIAGNOSIS — I10 Essential (primary) hypertension: Secondary | ICD-10-CM

## 2013-07-07 DIAGNOSIS — E1165 Type 2 diabetes mellitus with hyperglycemia: Secondary | ICD-10-CM

## 2013-07-07 DIAGNOSIS — H35039 Hypertensive retinopathy, unspecified eye: Secondary | ICD-10-CM

## 2013-07-07 DIAGNOSIS — E1139 Type 2 diabetes mellitus with other diabetic ophthalmic complication: Secondary | ICD-10-CM

## 2013-07-07 LAB — HM DIABETES EYE EXAM

## 2013-07-19 ENCOUNTER — Ambulatory Visit (INDEPENDENT_AMBULATORY_CARE_PROVIDER_SITE_OTHER): Payer: Federal, State, Local not specified - PPO | Admitting: Ophthalmology

## 2013-07-19 DIAGNOSIS — H33309 Unspecified retinal break, unspecified eye: Secondary | ICD-10-CM

## 2013-07-24 ENCOUNTER — Encounter: Payer: Self-pay | Admitting: Family Medicine

## 2013-08-02 ENCOUNTER — Other Ambulatory Visit: Payer: Self-pay | Admitting: Family Medicine

## 2013-10-20 ENCOUNTER — Ambulatory Visit (INDEPENDENT_AMBULATORY_CARE_PROVIDER_SITE_OTHER): Payer: Federal, State, Local not specified - PPO | Admitting: Family Medicine

## 2013-10-20 ENCOUNTER — Encounter: Payer: Self-pay | Admitting: Family Medicine

## 2013-10-20 VITALS — BP 141/90 | HR 77 | Temp 97.4°F | Resp 18 | Ht 79.0 in | Wt 286.0 lb

## 2013-10-20 DIAGNOSIS — E1165 Type 2 diabetes mellitus with hyperglycemia: Principal | ICD-10-CM

## 2013-10-20 DIAGNOSIS — E781 Pure hyperglyceridemia: Secondary | ICD-10-CM

## 2013-10-20 DIAGNOSIS — I10 Essential (primary) hypertension: Secondary | ICD-10-CM

## 2013-10-20 DIAGNOSIS — IMO0001 Reserved for inherently not codable concepts without codable children: Secondary | ICD-10-CM

## 2013-10-20 NOTE — Patient Instructions (Signed)
Check your sugar once daily (do a fasting check every other day alternating with a 2 hour "after meal" check every other day). Record these and bring them to your next visit in 4 mo.

## 2013-10-20 NOTE — Progress Notes (Signed)
OFFICE NOTE  10/20/2013  CC:  Chief Complaint  Patient presents with  . Follow-up   HPI: Patient is a 53 y.o. Caucasian male who is here for 4 mo f/u DM 2, HTN, hypertriglyceridemia. Has not checked his glucose at all since last visit.   No change in diet or exercise. Says he is too lazy, laughs but says it's the truth. He then says he will go ahead and go on increased dose of med if needed. No home bp's to report.  No side effects from any of his meds.  No CP, no vision complaints, no dizziness, no myalgias, no GI upset, no palpitations, no SOB.  Pertinent PMH:  Past medical, surgical, social, and family history reviewed and no changes are noted since last office visit.  MEDS:  Outpatient Prescriptions Prior to Visit  Medication Sig Dispense Refill  . amLODipine (NORVASC) 10 MG tablet TAKE 1 TABLET BY MOUTH EVERY DAY  90 tablet  0  . butalbital-acetaminophen-caffeine (FIORICET, ESGIC) 50-325-40 MG per tablet Take 2 tablets by mouth every 6 (six) hours as needed.       . fenofibrate (TRICOR) 145 MG tablet TAKE 1 TABLET BY MOUTH EVERY DAY  90 tablet  0  . irbesartan (AVAPRO) 300 MG tablet TAKE 1 TABLET BY MOUTH DAILY  90 tablet  0  . metFORMIN (GLUCOPHAGE-XR) 500 MG 24 hr tablet TAKE 2 TABLETS BY MOUTH DAILY WITH BREAKFAST  180 tablet  0  . metoprolol succinate (TOPROL-XL) 25 MG 24 hr tablet TAKE 1 TABLET BY MOUTH EVERY DAY  90 tablet  0  . glucose blood test strip Use as instructed  100 each  12   No facility-administered medications prior to visit.    PE: Blood pressure 141/90, pulse 77, temperature 97.4 F (36.3 C), temperature source Temporal, resp. rate 18, height 6\' 7"  (2.007 m), weight 286 lb (129.729 kg), SpO2 97.00%. Gen: Alert, well appearing.  Patient is oriented to person, place, time, and situation. AFFECT: pleasant, lucid thought and speech. No further exam today.  IMPRESSION AND PLAN:  1) DM 2, noncompliant with diet, exercise, monitoring. Last A1c went up  from 6.6% to 7.2%. Will repeat A1c today.  Anticipate need to increase metformin, possibly add additional med.  2) HTN: The current medical regimen is effective;  continue present plan and medications. Last lytes/cr 06/2013 normal.  3) Hypertriglyceridemia:  Lab Results  Component Value Date   CHOL 117 06/15/2013   HDL 30.50* 06/15/2013   LDLCALC 53 06/15/2013   TRIG 168.0* 06/15/2013   CHOLHDL 4 06/15/2013   Continue tricor.  FOLLOW UP: 4 mo

## 2013-10-20 NOTE — Progress Notes (Signed)
Pre visit review using our clinic review tool, if applicable. No additional management support is needed unless otherwise documented below in the visit note. 

## 2013-10-21 LAB — HEMOGLOBIN A1C: HEMOGLOBIN A1C: 7 % — AB (ref 4.6–6.5)

## 2013-10-25 MED ORDER — FENOFIBRATE 145 MG PO TABS: ORAL_TABLET | ORAL | Status: DC

## 2013-10-25 MED ORDER — METOPROLOL SUCCINATE ER 25 MG PO TB24: ORAL_TABLET | ORAL | Status: DC

## 2013-10-25 MED ORDER — IRBESARTAN 300 MG PO TABS: ORAL_TABLET | ORAL | Status: DC

## 2013-10-25 MED ORDER — AMLODIPINE BESYLATE 10 MG PO TABS: ORAL_TABLET | ORAL | Status: DC

## 2013-10-25 MED ORDER — METFORMIN HCL ER 500 MG PO TB24: 1000.0000 mg | ORAL_TABLET | Freq: Two times a day (BID) | ORAL | Status: DC

## 2013-10-25 NOTE — Addendum Note (Signed)
Addended by: Jannette Spanner on: 10/25/2013 09:49 AM   Modules accepted: Orders

## 2013-10-27 ENCOUNTER — Ambulatory Visit (INDEPENDENT_AMBULATORY_CARE_PROVIDER_SITE_OTHER): Payer: Federal, State, Local not specified - PPO | Admitting: Nurse Practitioner

## 2013-10-27 ENCOUNTER — Other Ambulatory Visit: Payer: Self-pay | Admitting: Family Medicine

## 2013-10-27 ENCOUNTER — Encounter: Payer: Self-pay | Admitting: Nurse Practitioner

## 2013-10-27 VITALS — BP 135/91 | HR 69 | Temp 98.0°F | Resp 18 | Ht 79.0 in | Wt 282.0 lb

## 2013-10-27 DIAGNOSIS — K5732 Diverticulitis of large intestine without perforation or abscess without bleeding: Secondary | ICD-10-CM

## 2013-10-27 MED ORDER — METRONIDAZOLE 500 MG PO TABS: 500.0000 mg | ORAL_TABLET | Freq: Two times a day (BID) | ORAL | Status: DC

## 2013-10-27 MED ORDER — SULFAMETHOXAZOLE-TMP DS 800-160 MG PO TABS: 1.0000 | ORAL_TABLET | Freq: Two times a day (BID) | ORAL | Status: DC

## 2013-10-27 NOTE — Patient Instructions (Signed)
Start liquid diet-beverages, soups, puddings, jello for 3 days. Add soft foods-steamed vegetables & soft fruits, mashed or baked potato, brown rice, scambled egg as tolerated until follow up appointment.   Start probiotic-Culterelle or [employer]. Take 2 hours after antibiotics. Take daily for at least 3 mos. F/u 10 days or sooner if you develop fever.

## 2013-10-27 NOTE — Progress Notes (Signed)
Subjective:     Brett Cruz is a 53 y.o. male who presents for evaluation of abdominal pain. Onset was 4 days ago. Symptoms have been stable. The pain is described as aching, dull and pressure-like, and is 4/10 in intensity. Pain is located in the LLQ without radiation.  Aggravating factors: none.  Alleviating factors: none. Associated symptoms: belching and flatus, mild fever, and nausea. The patient denies constipation and diarrhea. He recalls 2 episodes of diverticulitis in last 2 years.  The patient's history has been marked as reviewed and updated as appropriate.  Review of Systems Pertinent items are noted in HPI.     Objective:    BP 135/91  Pulse 69  Temp(Src) 98 F (36.7 C) (Oral)  Resp 18  Ht 6\' 7"  (2.007 m)  Wt 282 lb (127.914 kg)  BMI 31.76 kg/m2  SpO2 97% General appearance: alert, cooperative, appears stated age and no distress Head: Normocephalic, without obvious abnormality, atraumatic Eyes: negative findings: lids and lashes normal and conjunctivae and sclerae normal Lungs: clear to auscultation bilaterally Heart: regular rate and rhythm, S1, S2 normal, no murmur, click, rub or gallop Abdomen: normal findings: no masses palpable and no organomegaly and abnormal findings:  tender LLQ    Assessment:  1. Diverticulitis of colon (without mention of hemorrhage) Liquid diet 3 days, then add soft foods until re-eval. - sulfamethoxazole-trimethoprim (BACTRIM DS) 800-160 MG per tablet; Take 1 tablet by mouth 2 (two) times daily.  Dispense: 14 tablet; Refill: 0 - metroNIDAZOLE (FLAGYL) 500 MG tablet; Take 1 tablet (500 mg total) by mouth 2 (two) times daily.  Dispense: 14 tablet; Refill: 0  F/u 10 days or sooner if fever.

## 2013-10-27 NOTE — Telephone Encounter (Signed)
Patient walked in and wanted RX for diverticulitis.I told him Dr. Anitra Lauth wasn't here and I could send RX request to NP but patient just decided he would be seen by Layne today at 10:30am.

## 2013-10-27 NOTE — Progress Notes (Signed)
Pre visit review using our clinic review tool, if applicable. No additional management support is needed unless otherwise documented below in the visit note. 

## 2013-10-29 ENCOUNTER — Telehealth: Payer: Self-pay | Admitting: Family Medicine

## 2013-10-29 NOTE — Telephone Encounter (Signed)
Brett Cruz, he is talking about his glucophage XR 500mg , which I increased to 2 tabs in the morning and 2 tabs in the evening--the rx was done 10/25/13 according the the EMR.  Pls see if pharmacy can confirm what they dispensed to pt.-thx

## 2013-10-29 NOTE — Telephone Encounter (Signed)
Patient said when he got his last lab results he was advised that one of his medications was going to change. He just picked up his meds & there weren't any changes. Please contact patient.

## 2013-11-01 ENCOUNTER — Telehealth: Payer: Self-pay

## 2013-11-01 NOTE — Telephone Encounter (Signed)
Spoke to pharmacy.  The  Medication was correct at pharmacy.  Advised patient and he stated understanding.

## 2013-11-01 NOTE — Telephone Encounter (Signed)
Flu vaccine documentation

## 2013-12-03 ENCOUNTER — Ambulatory Visit (INDEPENDENT_AMBULATORY_CARE_PROVIDER_SITE_OTHER): Payer: Federal, State, Local not specified - PPO | Admitting: Ophthalmology

## 2013-12-06 ENCOUNTER — Encounter: Payer: Self-pay | Admitting: Family Medicine

## 2013-12-06 ENCOUNTER — Ambulatory Visit (INDEPENDENT_AMBULATORY_CARE_PROVIDER_SITE_OTHER): Payer: Federal, State, Local not specified - PPO | Admitting: Family Medicine

## 2013-12-06 VITALS — BP 129/85 | HR 80 | Temp 97.8°F | Resp 18 | Ht 79.0 in | Wt 289.0 lb

## 2013-12-06 DIAGNOSIS — L03116 Cellulitis of left lower limb: Secondary | ICD-10-CM

## 2013-12-06 MED ORDER — CEPHALEXIN 500 MG PO CAPS: 500.0000 mg | ORAL_CAPSULE | Freq: Three times a day (TID) | ORAL | Status: DC

## 2013-12-06 MED ORDER — MUPIROCIN 2 % EX OINT: 1.0000 "application " | TOPICAL_OINTMENT | Freq: Three times a day (TID) | CUTANEOUS | Status: DC

## 2013-12-06 NOTE — Progress Notes (Signed)
Pre visit review using our clinic review tool, if applicable. No additional management support is needed unless otherwise documented below in the visit note. 

## 2013-12-06 NOTE — Progress Notes (Signed)
OFFICE NOTE  12/06/2013  CC:  Chief Complaint  Patient presents with  . Insect Bite    L left x Saturday   HPI: Patient is a 53 y.o. Caucasian male who is here for 2 d hx of spot on left ankle, getting more and more sore, growing in size--started as what looked to him like a clear "bump".  No fever/chills/malaise. This spot is now about quarter sized and red/purple and he came to get it checked out.  No insect/spider bite noted by pt.  Pertinent PMH:  Past medical, surgical, social, and family history reviewed and no changes are noted since last office visit.  MEDS:  Outpatient Prescriptions Prior to Visit  Medication Sig Dispense Refill  . amLODipine (NORVASC) 10 MG tablet TAKE 1 TABLET BY MOUTH EVERY DAY 90 tablet 1  . butalbital-acetaminophen-caffeine (FIORICET, ESGIC) 50-325-40 MG per tablet Take 2 tablets by mouth every 6 (six) hours as needed.     . fenofibrate (TRICOR) 145 MG tablet TAKE 1 TABLET BY MOUTH EVERY DAY 90 tablet 1  . glucose blood test strip Use as instructed 100 each 12  . irbesartan (AVAPRO) 300 MG tablet TAKE 1 TABLET BY MOUTH DAILY 90 tablet 1  . metFORMIN (GLUCOPHAGE-XR) 500 MG 24 hr tablet Take 2 tablets (1,000 mg total) by mouth 2 (two) times daily. 120 tablet 12  . metoprolol succinate (TOPROL-XL) 25 MG 24 hr tablet TAKE 1 TABLET BY MOUTH EVERY DAY 90 tablet 1  . metroNIDAZOLE (FLAGYL) 500 MG tablet Take 1 tablet (500 mg total) by mouth 2 (two) times daily. 14 tablet 0  . sulfamethoxazole-trimethoprim (BACTRIM DS) 800-160 MG per tablet Take 1 tablet by mouth 2 (two) times daily. 14 tablet 0   No facility-administered medications prior to visit.    PE: Blood pressure 129/85, pulse 80, temperature 97.8 F (36.6 C), temperature source Temporal, resp. rate 18, height 6\' 7"  (2.007 m), weight 289 lb (131.09 kg), SpO2 97 %. Gen: Alert, well appearing.  Patient is oriented to person, place, time, and situation. Lateral aspect of left ankle with 2 cm x 1.8 mm  oblong erythematous/violacious macule with suggestion of small clear vesicle in two spots.  TTP.  No streaking or fluctuance.  No opening/drainage on skin. No edema.  IMPRESSION AND PLAN:  Localized area of skin infection, superficial. Start cephalexin 500 mg tid x 10d. Bactroban ointment tid to affected area IF this does have an opening/vesicle that breaks in the near future. Signs/symptoms to call or return for were reviewed and pt expressed understanding.  An After Visit Summary was printed and given to the patient.  FOLLOW UP: prn

## 2013-12-13 ENCOUNTER — Ambulatory Visit (INDEPENDENT_AMBULATORY_CARE_PROVIDER_SITE_OTHER): Payer: Federal, State, Local not specified - PPO | Admitting: Ophthalmology

## 2013-12-13 DIAGNOSIS — E11319 Type 2 diabetes mellitus with unspecified diabetic retinopathy without macular edema: Secondary | ICD-10-CM

## 2013-12-13 DIAGNOSIS — E11329 Type 2 diabetes mellitus with mild nonproliferative diabetic retinopathy without macular edema: Secondary | ICD-10-CM

## 2013-12-13 DIAGNOSIS — H33302 Unspecified retinal break, left eye: Secondary | ICD-10-CM

## 2013-12-13 DIAGNOSIS — H43813 Vitreous degeneration, bilateral: Secondary | ICD-10-CM

## 2013-12-13 DIAGNOSIS — I10 Essential (primary) hypertension: Secondary | ICD-10-CM

## 2013-12-13 DIAGNOSIS — H35033 Hypertensive retinopathy, bilateral: Secondary | ICD-10-CM

## 2013-12-17 ENCOUNTER — Ambulatory Visit (INDEPENDENT_AMBULATORY_CARE_PROVIDER_SITE_OTHER): Payer: Federal, State, Local not specified - PPO | Admitting: Family Medicine

## 2013-12-17 ENCOUNTER — Encounter: Payer: Self-pay | Admitting: Family Medicine

## 2013-12-17 VITALS — BP 135/89 | HR 79 | Temp 98.2°F | Resp 18 | Ht 79.0 in | Wt 289.0 lb

## 2013-12-17 DIAGNOSIS — M25473 Effusion, unspecified ankle: Secondary | ICD-10-CM | POA: Insufficient documentation

## 2013-12-17 DIAGNOSIS — L089 Local infection of the skin and subcutaneous tissue, unspecified: Secondary | ICD-10-CM | POA: Insufficient documentation

## 2013-12-17 DIAGNOSIS — R609 Edema, unspecified: Secondary | ICD-10-CM

## 2013-12-17 MED ORDER — AMOXICILLIN-POT CLAVULANATE 875-125 MG PO TABS: 1.0000 | ORAL_TABLET | Freq: Two times a day (BID) | ORAL | Status: DC

## 2013-12-17 NOTE — Progress Notes (Signed)
Pre visit review using our clinic review tool, if applicable. No additional management support is needed unless otherwise documented below in the visit note. 

## 2013-12-17 NOTE — Progress Notes (Signed)
OFFICE NOTE  12/17/2013  CC:  Chief Complaint  Patient presents with  . Foot Swelling    numb feeling in L foot     HPI: Patient is a 53 y.o. Caucasian male who is here for 11d f/u left ankle skin infection.   Sore is better but not gone. Some sensory deficit in ankle and foot today: feels a bit numb.  Swelling in ankle and foot persist on left. He has elevated it daily.  Pertinent PMH:  Past medical, surgical, social, and family history reviewed and no changes are noted since last office visit.  MEDS:  Outpatient Prescriptions Prior to Visit  Medication Sig Dispense Refill  . amLODipine (NORVASC) 10 MG tablet TAKE 1 TABLET BY MOUTH EVERY DAY 90 tablet 1  . butalbital-acetaminophen-caffeine (FIORICET, ESGIC) 50-325-40 MG per tablet Take 2 tablets by mouth every 6 (six) hours as needed.     . fenofibrate (TRICOR) 145 MG tablet TAKE 1 TABLET BY MOUTH EVERY DAY 90 tablet 1  . irbesartan (AVAPRO) 300 MG tablet TAKE 1 TABLET BY MOUTH DAILY 90 tablet 1  . metFORMIN (GLUCOPHAGE-XR) 500 MG 24 hr tablet Take 2 tablets (1,000 mg total) by mouth 2 (two) times daily. 120 tablet 12  . metoprolol succinate (TOPROL-XL) 25 MG 24 hr tablet TAKE 1 TABLET BY MOUTH EVERY DAY 90 tablet 1  . glucose blood test strip Use as instructed 100 each 12  . mupirocin ointment (BACTROBAN) 2 % Apply 1 application topically 3 (three) times daily. 15 g 0  . cephALEXin (KEFLEX) 500 MG capsule Take 1 capsule (500 mg total) by mouth 3 (three) times daily. 30 capsule 0   No facility-administered medications prior to visit.    PE: Blood pressure 135/89, pulse 79, temperature 98.2 F (36.8 C), temperature source Temporal, resp. rate 18, height 6\' 7"  (2.007 m), weight 289 lb (131.09 kg), SpO2 96 %. Gen: Alert, well appearing.  Patient is oriented to person, place, time, and situation. No malaise or fever. Lateral aspect of left ankle with 2 cm oval violaceous splotch of skin, thinning over this area but no fluid  noted beneath.  No fluctuance/abscess palpable.  No streaking.  No TTP.  2 + edema in left ankle down into foot (1+ edema there).  DP pulse 2+ bilat, PT pulse 1+ due to edema in this area.   No calf tenderness or erythema on either side.  Sensory testing shows a subtle decrease in light touch sensation on left ankle and foot but pt was hesitant in his anwering about this. He does have a touch of edema in RLL as per his usual.  IMPRESSION AND PLAN:  Left ankle focal infection with appropriate surrounding tissue edema superimposed on his chronic LE edema.  No sign of DVT or thrombophlebitis or neuro abnormality. Infection much improved but no completely resolved. Will give 10d of augmentin 875mg  bid.  An After Visit Summary was printed and given to the patient.  FOLLOW UP: prn

## 2014-03-02 ENCOUNTER — Ambulatory Visit: Payer: Federal, State, Local not specified - PPO | Admitting: Family Medicine

## 2014-03-23 ENCOUNTER — Encounter: Payer: Self-pay | Admitting: Family Medicine

## 2014-03-23 ENCOUNTER — Ambulatory Visit (INDEPENDENT_AMBULATORY_CARE_PROVIDER_SITE_OTHER): Payer: Federal, State, Local not specified - PPO | Admitting: Family Medicine

## 2014-03-23 VITALS — BP 135/86 | HR 73 | Temp 98.5°F | Resp 18 | Ht 79.0 in | Wt 285.0 lb

## 2014-03-23 DIAGNOSIS — I10 Essential (primary) hypertension: Secondary | ICD-10-CM

## 2014-03-23 DIAGNOSIS — E119 Type 2 diabetes mellitus without complications: Secondary | ICD-10-CM

## 2014-03-23 DIAGNOSIS — E781 Pure hyperglyceridemia: Secondary | ICD-10-CM

## 2014-03-23 LAB — BASIC METABOLIC PANEL
BUN: 16 mg/dL (ref 6–23)
CO2: 27 mEq/L (ref 19–32)
Calcium: 9.2 mg/dL (ref 8.4–10.5)
Chloride: 103 mEq/L (ref 96–112)
Creatinine, Ser: 0.72 mg/dL (ref 0.40–1.50)
GFR: 120.43 mL/min (ref >60.00)
Glucose, Bld: 166 mg/dL — ABNORMAL HIGH (ref 70–99)
Potassium: 4.3 mEq/L (ref 3.5–5.1)
SODIUM: 136 meq/L (ref 135–145)

## 2014-03-23 LAB — HEMOGLOBIN A1C: HEMOGLOBIN A1C: 7.3 % — AB (ref 4.6–6.5)

## 2014-03-23 NOTE — Progress Notes (Signed)
Pre visit review using our clinic review tool, if applicable. No additional management support is needed unless otherwise documented below in the visit note. 

## 2014-03-23 NOTE — Progress Notes (Signed)
OFFICE NOTE  03/23/2014  CC:  Chief Complaint  Patient presents with  . Follow-up    fasting   HPI: Patient is a 54 y.o. Caucasian male who is here for 5 mo f/u DM 2, HTN, hypertriglyceridemia. Glucophage XR increased to 2 500mg  tabs qAM and qhs at the time of last A1c 5 mo ago.  Not monitoring glucoses or blood pressure.   Diabetic diet on and off, worse during holiday season recently. Doing a bit better with exercise.  Feet: no burning, tingling, or numbness.  No hx of foot ulcer or other wound.  Pertinent PMH:  Past medical, surgical, social, and family history reviewed and no changes are noted since last office visit.  MEDS:  Outpatient Prescriptions Prior to Visit  Medication Sig Dispense Refill  . amLODipine (NORVASC) 10 MG tablet TAKE 1 TABLET BY MOUTH EVERY DAY 90 tablet 1  . butalbital-acetaminophen-caffeine (FIORICET, ESGIC) 50-325-40 MG per tablet Take 2 tablets by mouth every 6 (six) hours as needed.     . fenofibrate (TRICOR) 145 MG tablet TAKE 1 TABLET BY MOUTH EVERY DAY 90 tablet 1  . glucose blood test strip Use as instructed 100 each 12  . irbesartan (AVAPRO) 300 MG tablet TAKE 1 TABLET BY MOUTH DAILY 90 tablet 1  . metFORMIN (GLUCOPHAGE-XR) 500 MG 24 hr tablet Take 2 tablets (1,000 mg total) by mouth 2 (two) times daily. 120 tablet 12  . metoprolol succinate (TOPROL-XL) 25 MG 24 hr tablet TAKE 1 TABLET BY MOUTH EVERY DAY 90 tablet 1  . amoxicillin-clavulanate (AUGMENTIN) 875-125 MG per tablet Take 1 tablet by mouth 2 (two) times daily. 20 tablet 0  . mupirocin ointment (BACTROBAN) 2 % Apply 1 application topically 3 (three) times daily. 15 g 0   No facility-administered medications prior to visit.    PE: Blood pressure 135/86, pulse 73, temperature 98.5 F (36.9 C), temperature source Temporal, resp. rate 18, height 6\' 7"  (2.007 m), weight 285 lb (129.275 kg), SpO2 98 %. Gen: Alert, well appearing.  Patient is oriented to person, place, time, and  situation. Foot exam - bilateral normal in appearance; no swelling, tenderness or skin or vascular lesions. Color and temperature is normal. Sensation is intact. Peripheral pulses are palpable. Toenails are normal.   IMPRESSION AND PLAN:  1) DM 2, no complications.  Noncompliant with monitoring and somewhat noncomp with diet/exercise.   Feet exam normal today. Repeat A1c today as well as BMET. Encouraged pt to check glucose at least once (fasting daily and write down for my review at each visit).  2) HTN; The current medical regimen is effective;  continue present plan and medications.  3) Hypertriglyceridemia: last lipid panel 10/2013 was good.  Will repeat at next f/u in 4 mo for CPE.  An After Visit Summary was printed and given to the patient.  FOLLOW UP: 4 mo fasting CPE

## 2014-03-24 ENCOUNTER — Other Ambulatory Visit: Payer: Self-pay | Admitting: Family Medicine

## 2014-03-25 ENCOUNTER — Other Ambulatory Visit: Payer: Self-pay | Admitting: Family Medicine

## 2014-03-25 MED ORDER — PIOGLITAZONE HCL 15 MG PO TABS: 15.0000 mg | ORAL_TABLET | Freq: Every day | ORAL | Status: DC

## 2014-04-19 ENCOUNTER — Other Ambulatory Visit: Payer: Self-pay | Admitting: Family Medicine

## 2014-05-07 ENCOUNTER — Other Ambulatory Visit: Payer: Self-pay | Admitting: Family Medicine

## 2014-06-13 ENCOUNTER — Other Ambulatory Visit: Payer: Self-pay | Admitting: Family Medicine

## 2014-08-10 ENCOUNTER — Other Ambulatory Visit: Payer: Self-pay | Admitting: Family Medicine

## 2014-09-15 ENCOUNTER — Ambulatory Visit (INDEPENDENT_AMBULATORY_CARE_PROVIDER_SITE_OTHER): Payer: Federal, State, Local not specified - PPO | Admitting: Family Medicine

## 2014-09-15 ENCOUNTER — Encounter: Payer: Self-pay | Admitting: Family Medicine

## 2014-09-15 VITALS — BP 119/84 | HR 75 | Temp 98.4°F | Resp 16 | Ht 78.5 in | Wt 281.0 lb

## 2014-09-15 DIAGNOSIS — E782 Mixed hyperlipidemia: Secondary | ICD-10-CM

## 2014-09-15 DIAGNOSIS — Z0000 Encounter for general adult medical examination without abnormal findings: Secondary | ICD-10-CM

## 2014-09-15 DIAGNOSIS — Z125 Encounter for screening for malignant neoplasm of prostate: Secondary | ICD-10-CM | POA: Diagnosis not present

## 2014-09-15 DIAGNOSIS — E119 Type 2 diabetes mellitus without complications: Secondary | ICD-10-CM

## 2014-09-15 LAB — COMPREHENSIVE METABOLIC PANEL
ALT: 27 U/L (ref 0–53)
AST: 19 U/L (ref 0–37)
Albumin: 4.4 g/dL (ref 3.5–5.2)
Alkaline Phosphatase: 53 U/L (ref 39–117)
BILIRUBIN TOTAL: 0.5 mg/dL (ref 0.2–1.2)
BUN: 15 mg/dL (ref 6–23)
CO2: 27 meq/L (ref 19–32)
CREATININE: 0.74 mg/dL (ref 0.40–1.50)
Calcium: 9.4 mg/dL (ref 8.4–10.5)
Chloride: 104 mEq/L (ref 96–112)
GFR: 116.47 mL/min (ref >60.00)
GLUCOSE: 136 mg/dL — AB (ref 70–99)
Potassium: 4.3 mEq/L (ref 3.5–5.1)
Sodium: 140 mEq/L (ref 135–145)
Total Protein: 7.3 g/dL (ref 6.0–8.3)

## 2014-09-15 LAB — CBC WITH DIFFERENTIAL/PLATELET
BASOS PCT: 0.5 % (ref 0.0–3.0)
Basophils Absolute: 0 10*3/uL (ref 0.0–0.1)
Eosinophils Absolute: 0.1 10*3/uL (ref 0.0–0.7)
Eosinophils Relative: 1.6 % (ref 0.0–5.0)
HEMATOCRIT: 46.7 % (ref 39.0–52.0)
Hemoglobin: 16 g/dL (ref 13.0–17.0)
Lymphocytes Relative: 35.4 % (ref 12.0–46.0)
Lymphs Abs: 3 10*3/uL (ref 0.7–4.0)
MCHC: 34.2 g/dL (ref 30.0–36.0)
MCV: 90.1 fl (ref 78.0–100.0)
MONO ABS: 0.5 10*3/uL (ref 0.1–1.0)
Monocytes Relative: 6.2 % (ref 3.0–12.0)
NEUTROS ABS: 4.7 10*3/uL (ref 1.4–7.7)
Neutrophils Relative %: 56.3 % (ref 43.0–77.0)
Platelets: 289 10*3/uL (ref 150.0–400.0)
RBC: 5.19 Mil/uL (ref 4.22–5.81)
RDW: 13.2 % (ref 11.5–15.5)
WBC: 8.4 10*3/uL (ref 4.0–10.5)

## 2014-09-15 LAB — LIPID PANEL
CHOL/HDL RATIO: 3
Cholesterol: 114 mg/dL (ref 0–200)
HDL: 34.4 mg/dL — AB (ref >39.00)
LDL Cholesterol: 57 mg/dL (ref 0–99)
NonHDL: 79.29
TRIGLYCERIDES: 112 mg/dL (ref 0.0–149.0)
VLDL: 22.4 mg/dL (ref 0.0–40.0)

## 2014-09-15 LAB — MICROALBUMIN / CREATININE URINE RATIO
Creatinine,U: 99.8 mg/dL
Microalb Creat Ratio: 1.2 mg/g (ref 0.0–30.0)
Microalb, Ur: 1.2 mg/dL (ref 0.0–1.9)

## 2014-09-15 LAB — HEMOGLOBIN A1C: Hgb A1c MFr Bld: 6.5 % (ref 4.6–6.5)

## 2014-09-15 LAB — PSA: PSA: 0.91 ng/mL (ref 0.10–4.00)

## 2014-09-15 LAB — TSH: TSH: 2.03 u[IU]/mL (ref 0.35–4.50)

## 2014-09-15 NOTE — Progress Notes (Signed)
Pre visit review using our clinic review tool, if applicable. No additional management support is needed unless otherwise documented below in the visit note. 

## 2014-09-15 NOTE — Progress Notes (Signed)
Office Note 09/15/2014  CC:  Chief Complaint  Patient presents with  . Annual Exam    Pt is fasting.   HPI:  Brett Cruz is a 54 y.o. White male who is here for annual health maintenance exam.    Noncompliant with glucose monitoring still. Taking meds as instructed.  No acute complaints.  Denies polyuria, polydipsia, or abnormal wt loss.   Past Medical History  Diagnosis Date  . Hypertension approx 2010  . 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 Zigmund Daniel in [place].  Marland Kitchen Diverticulitis   . High triglycerides   . Mood disorder     dep/anx  . Obesity, Class I, BMI 30-34.9   . Cataract   . Glaucoma   . Chronic venous insufficiency     Past Surgical History  Procedure Laterality Date  . Lumbar disc surgery  1999  . Anterior cruciate ligament repair  1985  . Tonsilectomy, adenoidectomy, bilateral myringotomy and tubes  1966  . Colonoscopy  2011    diverticulosis but no polyps: recall 42yrs    Family History  Problem Relation Age of Onset  . Cancer Mother   . Cancer Father   . Heart disease Father   . Heart disease Brother     Social History   Social History  . Marital Status: Married    Spouse Name: N/A  . Number of Children: N/A  . Years of Education: N/A   Occupational History  . Not on file.   Social History Main Topics  . Smoking status: Never Smoker   . Smokeless tobacco: Never Used  . Alcohol Use: Yes     Comment: socially  . Drug Use: No  . Sexual Activity: Not on file   Other Topics Concern  . Not on file   Social History Narrative   Married, 2 college age sons.   Orig from [place].   Relocated to [facility] 2013.   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   No tobacco or drugs.  Alcohol: occasional.    Outpatient Prescriptions Prior to Visit  Medication Sig Dispense Refill  . amLODipine (NORVASC) 10 MG tablet TAKE 1 TABLET BY MOUTH EVERY DAY  90 tablet 0  . fenofibrate (TRICOR) 145 MG tablet TAKE 1 TABLET BY MOUTH EVERY DAY 90 tablet 0  . glucose blood test strip Use as instructed 100 each 12  . irbesartan (AVAPRO) 300 MG tablet TAKE 1 TABLET BY MOUTH EVERY DAY 90 tablet 0  . metFORMIN (GLUCOPHAGE-XR) 500 MG 24 hr tablet Take 2 tablets (1,000 mg total) by mouth 2 (two) times daily. 120 tablet 12  . metoprolol succinate (TOPROL-XL) 25 MG 24 hr tablet TAKE 1 TABLET BY MOUTH EVERY DAY 90 tablet 0  . pioglitazone (ACTOS) 15 MG tablet TAKE 1 TABLET BY MOUTH EVERY DAY 30 tablet 3  . butalbital-acetaminophen-caffeine (FIORICET, ESGIC) 50-325-40 MG per tablet Take 2 tablets by mouth every 6 (six) hours as needed.      No facility-administered medications prior to visit.    No Known Allergies  ROS Review of Systems  Constitutional: Negative for fever, chills, appetite change and fatigue.  HENT: Negative for congestion, dental problem, ear pain and sore throat.   Eyes: Negative for discharge, redness and visual disturbance.  Respiratory: Negative for cough, chest tightness, shortness of breath and wheezing.   Cardiovascular: Negative for chest pain, palpitations and leg swelling.  Gastrointestinal: Negative  for nausea, vomiting, abdominal pain, diarrhea and blood in stool.  Genitourinary: Negative for dysuria, urgency, frequency, hematuria, flank pain and difficulty urinating.  Musculoskeletal: Negative for myalgias, back pain, joint swelling, arthralgias and neck stiffness.  Skin: Negative for pallor and rash.  Neurological: Negative for dizziness, speech difficulty, weakness and headaches.  Hematological: Negative for adenopathy. Does not bruise/bleed easily.  Psychiatric/Behavioral: Negative for confusion and sleep disturbance. The patient is not nervous/anxious.     PE; Blood pressure 119/84, pulse 75, temperature 98.4 F (36.9 C), temperature source Oral, resp. rate 16, height 6' 6.5" (1.994 m), weight 281 lb (127.461 kg), SpO2  98 %. Gen: Alert, well appearing.  Patient is oriented to person, place, time, and situation. AFFECT: pleasant, lucid thought and speech. ENT: Ears: EACs clear, normal epithelium.  TMs with good light reflex and landmarks bilaterally.  Eyes: no injection, icteris, swelling, or exudate.  EOMI, PERRLA. Nose: no drainage or turbinate edema/swelling.  No injection or focal lesion.  Mouth: lips without lesion/swelling.  Oral mucosa pink and moist.  Dentition intact and without obvious caries or gingival swelling.  Oropharynx without erythema, exudate, or swelling.  Neck: supple/nontender.  No LAD, mass, or TM.  Carotid pulses 2+ bilaterally, without bruits. CV: RRR, no m/r/g.   LUNGS: CTA bilat, nonlabored resps, good aeration in all lung fields. ABD: soft, NT, ND, BS normal.  No hepatospenomegaly or mass.  No bruits. EXT: no clubbing or cyanosis.   He has 2+ pitting edema with freckling-type hemosiderin skin changes in both ankles. Musculoskeletal: no joint swelling, erythema, warmth, or tenderness.  ROM of all joints intact. Skin - no sores or suspicious lesions or rashes or color changes Rectal exam: negative without mass, lesions or tenderness, PROSTATE EXAM: smooth and symmetric without nodules or tenderness.  Pertinent labs:  Lab Results  Component Value Date   TSH 2.27 06/15/2013   Lab Results  Component Value Date   WBC 7.3 06/15/2013   HGB 15.0 06/15/2013   HCT 44.0 06/15/2013   MCV 91.9 06/15/2013   PLT 254.0 06/15/2013   Lab Results  Component Value Date   CREATININE 0.72 03/23/2014   BUN 16 03/23/2014   NA 136 03/23/2014   K 4.3 03/23/2014   CL 103 03/23/2014   CO2 27 03/23/2014   Lab Results  Component Value Date   ALT 36 06/15/2013   AST 27 06/15/2013   ALKPHOS 61 06/15/2013   BILITOT 0.5 06/15/2013   Lab Results  Component Value Date   CHOL 117 06/15/2013   Lab Results  Component Value Date   HDL 30.50* 06/15/2013   Lab Results  Component Value Date    LDLCALC 53 06/15/2013   Lab Results  Component Value Date   TRIG 168.0* 06/15/2013   Lab Results  Component Value Date   CHOLHDL 4 06/15/2013   Lab Results  Component Value Date   PSA 0.68 06/15/2013   Lab Results  Component Value Date   HGBA1C 7.3* 03/23/2014    ASSESSMENT AND PLAN:   1) DM 2, noncompliant with monitoring. Control has been pretty good historically. Will recheck A1c today and if all stable or improved then he requests his actos as a 90 d supply. Urine microalb/cr today.  Eye exam UTD: last done 08/29/14. Work on monitoring and TLC!  2) Health maintenance exam: Reviewed age and gender appropriate health maintenance issues (prudent diet, regular exercise, health risks of tobacco and excessive alcohol, use of seatbelts, fire alarms in home, use of  sunscreen).  Also reviewed age and gender appropriate health screening as well as vaccine recommendations. HP labs done today-fasting. DRE normal, PSA drawn. Colon ca screening UTD. Pt declined Hep C and HIV screening offered today.  An After Visit Summary was printed and given to the patient.  FOLLOW UP:  Return in about 4 months (around 01/15/2015) for routine chronic illness f/u.

## 2014-09-23 ENCOUNTER — Ambulatory Visit (INDEPENDENT_AMBULATORY_CARE_PROVIDER_SITE_OTHER): Payer: Federal, State, Local not specified - PPO | Admitting: Family Medicine

## 2014-09-23 ENCOUNTER — Encounter: Payer: Self-pay | Admitting: Family Medicine

## 2014-09-23 VITALS — BP 134/87 | HR 68 | Temp 97.5°F | Resp 16 | Wt 283.0 lb

## 2014-09-23 DIAGNOSIS — J0100 Acute maxillary sinusitis, unspecified: Secondary | ICD-10-CM

## 2014-09-23 DIAGNOSIS — J209 Acute bronchitis, unspecified: Secondary | ICD-10-CM

## 2014-09-23 DIAGNOSIS — J320 Chronic maxillary sinusitis: Secondary | ICD-10-CM | POA: Insufficient documentation

## 2014-09-23 DIAGNOSIS — R05 Cough: Secondary | ICD-10-CM | POA: Diagnosis not present

## 2014-09-23 DIAGNOSIS — R062 Wheezing: Secondary | ICD-10-CM | POA: Diagnosis not present

## 2014-09-23 DIAGNOSIS — R059 Cough, unspecified: Secondary | ICD-10-CM | POA: Insufficient documentation

## 2014-09-23 MED ORDER — BENZONATATE 200 MG PO CAPS: 200.0000 mg | ORAL_CAPSULE | Freq: Two times a day (BID) | ORAL | Status: DC | PRN

## 2014-09-23 MED ORDER — FLUTICASONE PROPIONATE 50 MCG/ACT NA SUSP: 2.0000 | Freq: Every day | NASAL | Status: DC

## 2014-09-23 MED ORDER — DOXYCYCLINE HYCLATE 100 MG PO TABS: 100.0000 mg | ORAL_TABLET | Freq: Two times a day (BID) | ORAL | Status: DC

## 2014-09-23 MED ORDER — ALBUTEROL SULFATE (2.5 MG/3ML) 0.083% IN NEBU
2.5000 mg | INHALATION_SOLUTION | Freq: Once | RESPIRATORY_TRACT | Status: AC
  Administered 2014-09-23: 2.5 mg via RESPIRATORY_TRACT

## 2014-09-23 MED ORDER — PREDNISONE 50 MG PO TABS: 50.0000 mg | ORAL_TABLET | Freq: Every day | ORAL | Status: DC

## 2014-09-23 NOTE — Progress Notes (Signed)
Subjective:    Patient ID: Brett Cruz, male    DOB: [birth year], 54 y.o.   MRN: [id number]  HPI  Productive cough with fever: Patient reports 4 days ago experiencing a subjective fever with chills. At that time he's had a harsh cough that is productive of brownish in nature. He feels a rattling in his chest with each breath. He noticed some left ear discomfort, but no increase in drainage. Mild sore throat, congestion, bilateral facial pressure and rhinorrhea. Patient reports eating and drinking well. He states he's not sleeping well because of his cough. He denies any nausea vomiting diarrhea or rash. He has tried over-the-counter Mucinex only. He reports there is for people at work have similar symptoms and had been out of work. Patient has no known allergies. He does have hypertension.  Never smoker Past Medical History  Diagnosis Date  . Hypertension approx 2010  . 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 Zigmund Daniel in [place].  Marland Kitchen Diverticulitis   . High triglycerides   . Mood disorder     dep/anx  . Obesity, Class I, BMI 30-34.9   . Cataract   . Glaucoma   . Chronic venous insufficiency    No Known Allergies Past Surgical History  Procedure Laterality Date  . Lumbar disc surgery  1999  . Anterior cruciate ligament repair  1985  . Tonsilectomy, adenoidectomy, bilateral myringotomy and tubes  1966  . Colonoscopy  2011    diverticulosis but no polyps: recall 27yrs   Family History  Problem Relation Age of Onset  . Cancer Mother   . Cancer Father   . Heart disease Father   . Heart disease Brother    Social History   Social History  . Marital Status: Married    Spouse Name: N/A  . Number of Children: N/A  . Years of Education: N/A   Occupational History  . Not on file.   Social History Main Topics  . Smoking status: Never Smoker   . Smokeless tobacco: Never Used  . Alcohol Use: Yes     Comment: socially    . Drug Use: No  . Sexual Activity: Not on file   Other Topics Concern  . Not on file   Social History Narrative   Married, 2 college age sons.   Orig from [place].   Relocated to [facility] 2013.   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   No tobacco or drugs.  Alcohol: occasional.   Review of Systems Per HPI, otherwise negative    Objective:   Physical Exam BP 134/87 mmHg  Pulse 68  Temp(Src) 97.5 F (36.4 C) (Oral)  Resp 16  Wt 283 lb (128.368 kg)  SpO2 98% Gen: Afebrile. No acute distress. Appears fatigued, nontoxic in appearance. Well-developed, well-nourished. HENT: AT. [place]. Bilateral TM visualized shiny and full in appearance.MMM. Bilateral nares with erythema, mild swelling. Throat without erythema or exudates. Cobblestoning present, moderate to severe cough during exam. Hoarseness present.  Eyes:Pupils Equal Round Reactive to light, Extraocular movements intact,  Conjunctiva without redness, discharge or icterus. Neck: Supple, mild cervical lymphadenopathy CV: RRR no murmur appreciated Chest: Bilateral wheezing diffusely, rhonchi present, no crackles. Abd: Soft. Round. NTND. BS present. No Masses palpated.  Skin: No rashes, purpura or petechiae.     Assessment & Plan:  Brett Cruz is a 54 y.o. Caucasian male with maxillary sinusitis, with signs and symptoms of  bronchitis, with cough and wheeze. - Rest, hydrate, Tylenol/Advil for fever or body aches or pains. - Breathing treatment in office today of albuterol, decrease in wheezing appreciated. - Prednisone, doxycycline, Tessalon Perles, continue Mucinex. - Follow-up in one week if no improvement.

## 2014-09-23 NOTE — Progress Notes (Signed)
Pre visit review using our clinic review tool, if applicable. No additional management support is needed unless otherwise documented below in the visit note. 

## 2014-09-23 NOTE — Patient Instructions (Signed)
Acute Bronchitis Bronchitis is inflammation of the airways that extend from the windpipe into the lungs (bronchi). The inflammation often causes mucus to develop. This leads to a cough, which is the most common symptom of bronchitis.  In acute bronchitis, the condition usually develops suddenly and goes away over time, usually in a couple weeks. Smoking, allergies, and asthma can make bronchitis worse. Repeated episodes of bronchitis may cause further lung problems.  CAUSES Acute bronchitis is most often caused by the same virus that causes a cold. The virus can spread from person to person (contagious) through coughing, sneezing, and touching contaminated objects. SIGNS AND SYMPTOMS   Cough.   Fever.   Coughing up mucus.   Body aches.   Chest congestion.   Chills.   Shortness of breath.   Sore throat.  DIAGNOSIS  Acute bronchitis is usually diagnosed through a physical exam. Your health care provider will also ask you questions about your medical history. Tests, such as chest X-rays, are sometimes done to rule out other conditions.  TREATMENT  Acute bronchitis usually goes away in a couple weeks. Oftentimes, no medical treatment is necessary. Medicines are sometimes given for relief of fever or cough. Antibiotic medicines are usually not needed but may be prescribed in certain situations. In some cases, an inhaler may be recommended to help reduce shortness of breath and control the cough. A cool mist vaporizer may also be used to help thin bronchial secretions and make it easier to clear the chest.  HOME CARE INSTRUCTIONS  Get plenty of rest.   Drink enough fluids to keep your urine clear or pale yellow (unless you have a medical condition that requires fluid restriction). Increasing fluids may help thin your respiratory secretions (sputum) and reduce chest congestion, and it will prevent dehydration.   Take medicines only as directed by your health care provider.  If  you were prescribed an antibiotic medicine, finish it all even if you start to feel better.  Avoid smoking and secondhand smoke. Exposure to cigarette smoke or irritating chemicals will make bronchitis worse. If you are a smoker, consider using nicotine gum or skin patches to help control withdrawal symptoms. Quitting smoking will help your lungs heal faster.   Reduce the chances of another bout of acute bronchitis by washing your hands frequently, avoiding people with cold symptoms, and trying not to touch your hands to your mouth, nose, or eyes.   Keep all follow-up visits as directed by your health care provider.  SEEK MEDICAL CARE IF: Your symptoms do not improve after 1 week of treatment.  SEEK IMMEDIATE MEDICAL CARE IF:  You develop an increased fever or chills.   You have chest pain.   You have severe shortness of breath.  You have bloody sputum.   You develop dehydration.  You faint or repeatedly feel like you are going to pass out.  You develop repeated vomiting.  You develop a severe headache. MAKE SURE YOU:   Understand these instructions.  Will watch your condition.  Will get help right away if you are not doing well or get worse. Document Released: 02/29/2004 Document Revised: 06/07/2013 Document Reviewed: 07/14/2012 [facility] Patient Information 2015 [place], [place]. This information is not intended to replace advice given to you by your health care provider. Make sure you discuss any questions you have with your health care provider.  Please attempt to get plenty of rest, remain well hydrated, you can take Advil/Tylenol for fevers chills or body aches. You prescribed Flonase  today to help with the congestion in your nose and ears. Doxycycline as an antibiotics prescribed to you today for your sinus infection/upper respiratory infection. Tessalon Perles were prescribed to you today to help you with the cough. You can also pick up an over-the-counter cough  medicine as long as it does not have a decongestion and a. Continue your Mucinex until your secretions are thin. I have prescribed prednisone for you to take one time a day with breakfast for 5 days. If no improvement in one week please come in to be reevaluated.

## 2014-10-09 ENCOUNTER — Other Ambulatory Visit: Payer: Self-pay | Admitting: Family Medicine

## 2014-10-11 NOTE — Telephone Encounter (Signed)
RF request for pioglitazone LOV: 09/15/14 Next ov: 02/08/15 Last written: 06/14/14 #30 w/ 3RF

## 2014-11-06 ENCOUNTER — Other Ambulatory Visit: Payer: Self-pay | Admitting: Family Medicine

## 2014-11-07 NOTE — Telephone Encounter (Signed)
LOV: 09/15/14 NOV: 02/08/15  RF request for amlodipine Last written: 08/11/14 #90 w/ 0RF  RF request for metoprolol Last written: 08/11/14 #90 w/ 0RF  RF request for irbesartan Last written: 08/11/14 #90 w/ 0RF  RF request for fenofibrate Last written: 08/11/14 #90 w/ 0RF

## 2014-11-15 ENCOUNTER — Encounter: Payer: Self-pay | Admitting: Family Medicine

## 2014-11-15 ENCOUNTER — Ambulatory Visit (INDEPENDENT_AMBULATORY_CARE_PROVIDER_SITE_OTHER): Payer: Federal, State, Local not specified - PPO | Admitting: Family Medicine

## 2014-11-15 VITALS — BP 133/89 | HR 72 | Temp 98.0°F | Resp 16 | Ht 78.5 in | Wt 281.0 lb

## 2014-11-15 DIAGNOSIS — S39012A Strain of muscle, fascia and tendon of lower back, initial encounter: Secondary | ICD-10-CM

## 2014-11-15 DIAGNOSIS — M5416 Radiculopathy, lumbar region: Secondary | ICD-10-CM | POA: Diagnosis not present

## 2014-11-15 MED ORDER — HYDROCODONE-ACETAMINOPHEN 5-325 MG PO TABS: 1.0000 | ORAL_TABLET | Freq: Four times a day (QID) | ORAL | Status: DC | PRN

## 2014-11-15 MED ORDER — PREDNISONE 20 MG PO TABS: ORAL_TABLET | ORAL | Status: DC

## 2014-11-15 NOTE — Progress Notes (Signed)
Pre visit review using our clinic review tool, if applicable. No additional management support is needed unless otherwise documented below in the visit note. 

## 2014-11-15 NOTE — Progress Notes (Signed)
OFFICE NOTE  11/15/2014  CC:  Chief Complaint  Patient presents with  . Back Pain    left side x 3 days   HPI: Patient is a 54 y.o. Caucasian male who is here for back pain. Onse of L LB pain about 48-72 hours ago, mild at first then in last 24h has increased significantly. Played tennis the day prior to onset of pain but recalls no strain or trauma or pain at that time. Pain is focused in L LB/upper glut region and extends into L hip, with occ radiating pain down L leg but more often has feeling of intermittent numbness/tingling in lateral aspect of L leg (lower >upper) and top of L foot.  No loss of bowel/bladder control, no saddle anesthesia. Worse pain with sitting and with getting up from sitting or lying down.   Pertinent PMH:  Past medical, surgical, social, and family history reviewed and no changes are noted since last office visit. +Hx of lumbar back surgery in late 1990s.  No signif back problems since that time.  MEDS:  Outpatient Prescriptions Prior to Visit  Medication Sig Dispense Refill  . amLODipine (NORVASC) 10 MG tablet TAKE 1 TABLET BY MOUTH EVERY DAY 90 tablet 3  . fenofibrate (TRICOR) 145 MG tablet TAKE 1 TABLET BY MOUTH EVERY DAY 90 tablet 3  . fluticasone (FLONASE) 50 MCG/ACT nasal spray Place 2 sprays into both nostrils daily. 16 g 1  . glucose blood test strip Use as instructed 100 each 12  . irbesartan (AVAPRO) 300 MG tablet TAKE 1 TABLET BY MOUTH EVERY DAY 90 tablet 3  . metFORMIN (GLUCOPHAGE-XR) 500 MG 24 hr tablet Take 2 tablets (1,000 mg total) by mouth 2 (two) times daily. 120 tablet 12  . metoprolol succinate (TOPROL-XL) 25 MG 24 hr tablet TAKE 1 TABLET BY MOUTH EVERY DAY 90 tablet 3  . pioglitazone (ACTOS) 15 MG tablet TAKE 1 TABLET BY MOUTH DAILY 90 tablet 1  . benzonatate (TESSALON) 200 MG capsule Take 1 capsule (200 mg total) by mouth 2 (two) times daily as needed for cough. (Patient not taking: Reported on 11/15/2014) 30 capsule 0  . doxycycline  (VIBRA-TABS) 100 MG tablet Take 1 tablet (100 mg total) by mouth 2 (two) times daily. (Patient not taking: Reported on 11/15/2014) 20 tablet 0  . predniSONE (DELTASONE) 50 MG tablet Take 1 tablet (50 mg total) by mouth daily with breakfast. (Patient not taking: Reported on 11/15/2014) 5 tablet 0   No facility-administered medications prior to visit.    PE: Blood pressure 133/89, pulse 72, temperature 98 F (36.7 C), temperature source Oral, resp. rate 16, height 6' 6.5" (1.994 m), weight 281 lb (127.461 kg), SpO2 96 %. Gen: Alert, appears to be in moderate pain, esp with movement.  Patient is oriented to person, place, time, and situation. ROM: intact except for decreased forward flexion (to about 80 deg).  Flexion of L spine and L lateral bending of L spine makes the pain worse.   Sitting SLR neg bilat.  Flexion/extension/ER/IR of L hip does not cause any pain. No tenderness around ischial tuberosity. No pain with McMurray's. DTRs: 1+ patellar bilat, trace R achilles but absent L achilles. LE strength 5/5 prox and dist bilat.   IMPRESSION AND PLAN:  Acute low back strain, with some symptoms suggestive of radiculopathy. Exam finding to support radiculopathy: absent L achilles reflex. Plan: prednisone 40mg  qd x 5d. PT referral ASAP. Vicodin 5/325, 1-2 q6h prn, #30. No imaging indicated at this  time.  An After Visit Summary was printed and given to the patient.  FOLLOW UP: 2 weeks

## 2014-11-30 ENCOUNTER — Other Ambulatory Visit: Payer: Self-pay | Admitting: Family Medicine

## 2014-11-30 NOTE — Telephone Encounter (Signed)
RF request for metformin LOV: 11/15/14 Next ov: 02/08/15 Last written: 10/25/13 #120 w/ 12RF

## 2014-12-02 ENCOUNTER — Ambulatory Visit (INDEPENDENT_AMBULATORY_CARE_PROVIDER_SITE_OTHER): Payer: Federal, State, Local not specified - PPO | Admitting: Family Medicine

## 2014-12-02 ENCOUNTER — Encounter: Payer: Self-pay | Admitting: Family Medicine

## 2014-12-02 VITALS — BP 117/81 | HR 66 | Temp 98.5°F | Resp 16 | Ht 78.5 in | Wt 283.0 lb

## 2014-12-02 DIAGNOSIS — M5442 Lumbago with sciatica, left side: Secondary | ICD-10-CM | POA: Diagnosis not present

## 2014-12-02 NOTE — Progress Notes (Signed)
Pre visit review using our clinic review tool, if applicable. No additional management support is needed unless otherwise documented below in the visit note. 

## 2014-12-02 NOTE — Progress Notes (Signed)
OFFICE VISIT  12/02/2014   CC:  Chief Complaint  Patient presents with  . Back Pain     HPI:    Patient is a 54 y.o. Caucasian male who presents for 2 week f/u acute low back strain. Has been improving gradually, in PT, took prednisone burst I rx'd, only had to take a few vicodin. Says L leg radiating pain and L leg numbness are improved, as his L sided LBP.   No new complaints.   Past Medical History  Diagnosis Date  . Hypertension approx 2010  . 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 Zigmund Daniel in [place].  Marland Kitchen Diverticulitis   . High triglycerides   . Mood disorder ([place])     dep/anx  . Obesity, Class I, BMI 30-34.9   . Cataract   . Glaucoma   . Chronic venous insufficiency     Past Surgical History  Procedure Laterality Date  . Lumbar disc surgery  1999  . Anterior cruciate ligament repair  1985  . Tonsilectomy, adenoidectomy, bilateral myringotomy and tubes  1966  . Colonoscopy  2011    diverticulosis but no polyps: recall 26yrs    Outpatient Prescriptions Prior to Visit  Medication Sig Dispense Refill  . amLODipine (NORVASC) 10 MG tablet TAKE 1 TABLET BY MOUTH EVERY DAY 90 tablet 3  . fenofibrate (TRICOR) 145 MG tablet TAKE 1 TABLET BY MOUTH EVERY DAY 90 tablet 3  . fluticasone (FLONASE) 50 MCG/ACT nasal spray Place 2 sprays into both nostrils daily. 16 g 1  . glucose blood test strip Use as instructed 100 each 12  . irbesartan (AVAPRO) 300 MG tablet TAKE 1 TABLET BY MOUTH EVERY DAY 90 tablet 3  . metFORMIN (GLUCOPHAGE-XR) 500 MG 24 hr tablet TAKE 2 TABLETS BY MOUTH TWICE DAILY 120 tablet 6  . metoprolol succinate (TOPROL-XL) 25 MG 24 hr tablet TAKE 1 TABLET BY MOUTH EVERY DAY 90 tablet 3  . pioglitazone (ACTOS) 15 MG tablet TAKE 1 TABLET BY MOUTH DAILY 90 tablet 1  . HYDROcodone-acetaminophen (NORCO/VICODIN) 5-325 MG tablet Take 1 tablet by mouth every 6 (six) hours as needed for moderate pain.  (Patient not taking: Reported on 12/02/2014) 30 tablet 0  . predniSONE (DELTASONE) 20 MG tablet 2 tabs po qd x 5d (Patient not taking: Reported on 12/02/2014) 10 tablet 0   No facility-administered medications prior to visit.    No Known Allergies  ROS As per HPI  PE: Blood pressure 117/81, pulse 66, temperature 98.5 F (36.9 C), temperature source Oral, resp. rate 16, height 6' 6.5" (1.994 m), weight 283 lb (128.368 kg), SpO2 95 %. Gen: Alert, well appearing.  Patient is oriented to person, place, time, and situation. BACK :  ROM intact except he can only flex to 90 deg due to hamstring tightness. No LB TTP. Sitting SLR neg bilat.  LE strength 5/5 prox/dist bilat. Cannot elicit DTR in either knee or ankle today.  LABS:  none  IMPRESSION AND PLAN:  Acute low back strain with L sided radiculopathy. Improving. Continue PT but may go to once weekly visits now. Use NSAID as first line pain med, resort to vicodin prn severe pain unresponsive to NSAID. Signs/symptoms to call or return for were reviewed and pt expressed understanding.  An After Visit Summary was printed and given to the patient.  FOLLOW UP: keep routine chronic illness f/u set for 02/2015

## 2014-12-19 ENCOUNTER — Ambulatory Visit (INDEPENDENT_AMBULATORY_CARE_PROVIDER_SITE_OTHER): Payer: Federal, State, Local not specified - PPO | Admitting: Ophthalmology

## 2015-02-08 ENCOUNTER — Ambulatory Visit (INDEPENDENT_AMBULATORY_CARE_PROVIDER_SITE_OTHER): Payer: Federal, State, Local not specified - PPO | Admitting: Family Medicine

## 2015-02-08 ENCOUNTER — Encounter: Payer: Self-pay | Admitting: Family Medicine

## 2015-02-08 VITALS — BP 120/80 | HR 73 | Temp 98.4°F | Resp 16 | Ht 78.5 in | Wt 281.2 lb

## 2015-02-08 DIAGNOSIS — E781 Pure hyperglyceridemia: Secondary | ICD-10-CM

## 2015-02-08 DIAGNOSIS — E118 Type 2 diabetes mellitus with unspecified complications: Secondary | ICD-10-CM | POA: Diagnosis not present

## 2015-02-08 DIAGNOSIS — I10 Essential (primary) hypertension: Secondary | ICD-10-CM | POA: Diagnosis not present

## 2015-02-08 LAB — POCT GLYCOSYLATED HEMOGLOBIN (HGB A1C): HEMOGLOBIN A1C: 6.4

## 2015-02-08 NOTE — Progress Notes (Signed)
Pre visit review using our clinic review tool, if applicable. No additional management support is needed unless otherwise documented below in the visit note. 

## 2015-02-08 NOTE — Progress Notes (Signed)
OFFICE VISIT  02/08/2015   CC:  Chief Complaint  Patient presents with  . Follow-up    Pt is fasting.    HPI:    Patient is a 55 y.o. Caucasian male who presents for 4 mo f/u DM 2, HTN, hypertriglyceridemia.  He tells me his back pain w/radiculop is MUCH better, no longer taking any meds for it.  No home glucose monitoring being done still. Diabetic diet for the most part.    BP: no home monitoring.  Compliant with meds.  No CP, no SOB, no feet pain, no vision complaints, no HAs, no dizziness.   Past Medical History  Diagnosis Date  . Hypertension approx 2010  . 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 Zigmund Daniel in [place].  Marland Kitchen Diverticulitis   . High triglycerides   . Mood disorder ([place])     dep/anx  . Obesity, Class I, BMI 30-34.9   . Cataract   . Glaucoma   . Chronic venous insufficiency     Past Surgical History  Procedure Laterality Date  . Lumbar disc surgery  1999  . Anterior cruciate ligament repair  1985  . Tonsilectomy, adenoidectomy, bilateral myringotomy and tubes  1966  . Colonoscopy  2011    diverticulosis but no polyps: recall 76yrs    Outpatient Prescriptions Prior to Visit  Medication Sig Dispense Refill  . amLODipine (NORVASC) 10 MG tablet TAKE 1 TABLET BY MOUTH EVERY DAY 90 tablet 3  . fenofibrate (TRICOR) 145 MG tablet TAKE 1 TABLET BY MOUTH EVERY DAY 90 tablet 3  . fluticasone (FLONASE) 50 MCG/ACT nasal spray Place 2 sprays into both nostrils daily. 16 g 1  . glucose blood test strip Use as instructed 100 each 12  . irbesartan (AVAPRO) 300 MG tablet TAKE 1 TABLET BY MOUTH EVERY DAY 90 tablet 3  . metFORMIN (GLUCOPHAGE-XR) 500 MG 24 hr tablet TAKE 2 TABLETS BY MOUTH TWICE DAILY 120 tablet 6  . metoprolol succinate (TOPROL-XL) 25 MG 24 hr tablet TAKE 1 TABLET BY MOUTH EVERY DAY 90 tablet 3  . pioglitazone (ACTOS) 15 MG tablet TAKE 1 TABLET BY MOUTH DAILY 90 tablet 1   No  facility-administered medications prior to visit.    No Known Allergies  ROS As per HPI  PE: Blood pressure 140/89, pulse 73, temperature 98.4 F (36.9 C), temperature source Oral, resp. rate 16, height 6' 6.5" (1.994 m), weight 281 lb 4 oz (127.574 kg), SpO2 100 %. BP repeat today 120/80 Gen: Alert, well appearing.  Patient is oriented to person, place, time, and situation. AFFECT: pleasant, lucid thought and speech. No further exam today.  LABS:  Lab Results  Component Value Date   TSH 2.03 09/15/2014   Lab Results  Component Value Date   WBC 8.4 09/15/2014   HGB 16.0 09/15/2014   HCT 46.7 09/15/2014   MCV 90.1 09/15/2014   PLT 289.0 09/15/2014   Lab Results  Component Value Date   CREATININE 0.74 09/15/2014   BUN 15 09/15/2014   NA 140 09/15/2014   K 4.3 09/15/2014   CL 104 09/15/2014   CO2 27 09/15/2014   Lab Results  Component Value Date   ALT 27 09/15/2014   AST 19 09/15/2014   ALKPHOS 53 09/15/2014   BILITOT 0.5 09/15/2014   Lab Results  Component Value Date   CHOL 114 09/15/2014   Lab Results  Component Value Date   HDL 34.40* 09/15/2014  Lab Results  Component Value Date   LDLCALC 57 09/15/2014   Lab Results  Component Value Date   TRIG 112.0 09/15/2014   Lab Results  Component Value Date   CHOLHDL 3 09/15/2014   Lab Results  Component Value Date   PSA 0.91 09/15/2014   PSA 0.68 06/15/2013   Lab Results  Component Value Date   HGBA1C 6.4 02/08/2015   POCT HbA1c today was 6.4%  IMPRESSION AND PLAN:  1) DM 2: noncompliant with monitoring as usual. Hba1c today: 6.4%--excellent.  2) Hypertension: The current medical regimen is effective;  continue present plan and medications. Lytes/cr good 5 mo ago.  Repeat at next f/u.  3) Hypertriglyceridemia: last lipid panel 5 mo ago excellent. Will repeat this again in 4-6 mo.    4) Low back strain with lumbar radiculopathy: resolving appropriately, pt no longer requiring any pain med  or PT.  An After Visit Summary was printed and given to the patient.  FOLLOW UP: Return in about 4 months (around 06/08/2015) for routine chronic illness f/u.

## 2015-03-22 ENCOUNTER — Other Ambulatory Visit: Payer: Self-pay | Admitting: Family Medicine

## 2015-03-22 NOTE — Telephone Encounter (Signed)
RF request for pioglitazone LOV: 02/08/15 Next ov: None Last written: 10/11/14 #90 w/ 1RF

## 2015-03-29 ENCOUNTER — Ambulatory Visit (INDEPENDENT_AMBULATORY_CARE_PROVIDER_SITE_OTHER): Payer: Federal, State, Local not specified - PPO | Admitting: Ophthalmology

## 2015-05-01 ENCOUNTER — Ambulatory Visit (INDEPENDENT_AMBULATORY_CARE_PROVIDER_SITE_OTHER): Payer: Federal, State, Local not specified - PPO | Admitting: Ophthalmology

## 2015-05-01 DIAGNOSIS — I10 Essential (primary) hypertension: Secondary | ICD-10-CM | POA: Diagnosis not present

## 2015-05-01 DIAGNOSIS — H33302 Unspecified retinal break, left eye: Secondary | ICD-10-CM | POA: Diagnosis not present

## 2015-05-01 DIAGNOSIS — H43813 Vitreous degeneration, bilateral: Secondary | ICD-10-CM

## 2015-05-01 DIAGNOSIS — H35033 Hypertensive retinopathy, bilateral: Secondary | ICD-10-CM

## 2015-05-01 DIAGNOSIS — E113293 Type 2 diabetes mellitus with mild nonproliferative diabetic retinopathy without macular edema, bilateral: Secondary | ICD-10-CM | POA: Diagnosis not present

## 2015-05-01 DIAGNOSIS — E11319 Type 2 diabetes mellitus with unspecified diabetic retinopathy without macular edema: Secondary | ICD-10-CM

## 2015-08-04 ENCOUNTER — Other Ambulatory Visit: Payer: Self-pay | Admitting: Family Medicine

## 2015-08-04 NOTE — Telephone Encounter (Signed)
RF request for metformin LOV: 02/08/15 Next ov: None Last written: 11/30/14 #120 w/ 0RF  Rx sent for #360 w/ 0Rf. Pt needs office visit for more refills. Pt advised and voiced understanding.  Apt made for 08/10/15 at 8:00am.

## 2015-08-10 ENCOUNTER — Ambulatory Visit (INDEPENDENT_AMBULATORY_CARE_PROVIDER_SITE_OTHER): Payer: Federal, State, Local not specified - PPO | Admitting: Family Medicine

## 2015-08-10 ENCOUNTER — Encounter: Payer: Self-pay | Admitting: Family Medicine

## 2015-08-10 VITALS — BP 122/82 | HR 60 | Temp 97.9°F | Resp 16 | Ht 78.5 in | Wt 271.5 lb

## 2015-08-10 DIAGNOSIS — E781 Pure hyperglyceridemia: Secondary | ICD-10-CM | POA: Diagnosis not present

## 2015-08-10 DIAGNOSIS — E118 Type 2 diabetes mellitus with unspecified complications: Secondary | ICD-10-CM | POA: Diagnosis not present

## 2015-08-10 DIAGNOSIS — I10 Essential (primary) hypertension: Secondary | ICD-10-CM | POA: Diagnosis not present

## 2015-08-10 LAB — COMPREHENSIVE METABOLIC PANEL
ALBUMIN: 4.4 g/dL (ref 3.5–5.2)
ALT: 28 U/L (ref 0–53)
AST: 21 U/L (ref 0–37)
Alkaline Phosphatase: 53 U/L (ref 39–117)
BILIRUBIN TOTAL: 0.7 mg/dL (ref 0.2–1.2)
BUN: 12 mg/dL (ref 6–23)
CALCIUM: 9.5 mg/dL (ref 8.4–10.5)
CO2: 27 meq/L (ref 19–32)
Chloride: 105 mEq/L (ref 96–112)
Creatinine, Ser: 0.83 mg/dL (ref 0.40–1.50)
GFR: 101.69 mL/min (ref >60.00)
Glucose, Bld: 120 mg/dL — ABNORMAL HIGH (ref 70–99)
Potassium: 4 mEq/L (ref 3.5–5.1)
SODIUM: 139 meq/L (ref 135–145)
TOTAL PROTEIN: 6.8 g/dL (ref 6.0–8.3)

## 2015-08-10 LAB — LIPID PANEL
CHOLESTEROL: 107 mg/dL (ref 0–200)
HDL: 35.9 mg/dL — ABNORMAL LOW (ref >39.00)
LDL CALC: 43 mg/dL (ref 0–99)
NonHDL: 71.28
TRIGLYCERIDES: 142 mg/dL (ref 0.0–149.0)
Total CHOL/HDL Ratio: 3
VLDL: 28.4 mg/dL (ref 0.0–40.0)

## 2015-08-10 LAB — HEMOGLOBIN A1C: Hgb A1c MFr Bld: 5.9 % (ref 4.6–6.5)

## 2015-08-10 MED ORDER — LANCETS 30G MISC: Status: AC

## 2015-08-10 NOTE — Progress Notes (Signed)
Pre visit review using our clinic review tool, if applicable. No additional management support is needed unless otherwise documented below in the visit note. 

## 2015-08-10 NOTE — Progress Notes (Signed)
OFFICE VISIT  08/10/2015   CC:  Chief Complaint  Patient presents with  . Follow-up    Pt is fasting.    HPI:    Patient is a 55 y.o. Caucasian male who presents for f/u DM 2, HTN, hypertriglyceridemia. Feeling good.  Compliant with all meds.  Not monitoring glucoses or bp at home but feels good.  Eating diabetic/low fat diet. Exercising more.  Has lost about 10 lbs in last 6-7 mo.   Feet: no burning, tingling, or numbness.  No hx of ulcer.  Past Medical History  Diagnosis Date  . Hypertension approx 2010  . 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 Zigmund Daniel in [place].  Marland Kitchen Diverticulitis   . High triglycerides   . Mood disorder ([place])     dep/anx  . Obesity, Class I, BMI 30-34.9   . Cataract   . Glaucoma   . Chronic venous insufficiency     Past Surgical History  Procedure Laterality Date  . Lumbar disc surgery  1999  . Anterior cruciate ligament repair  1985  . Tonsilectomy, adenoidectomy, bilateral myringotomy and tubes  1966  . Colonoscopy  2011    diverticulosis but no polyps: recall 49yrs    Outpatient Prescriptions Prior to Visit  Medication Sig Dispense Refill  . amLODipine (NORVASC) 10 MG tablet TAKE 1 TABLET BY MOUTH EVERY DAY 90 tablet 3  . fenofibrate (TRICOR) 145 MG tablet TAKE 1 TABLET BY MOUTH EVERY DAY 90 tablet 3  . glucose blood test strip Use as instructed 100 each 12  . irbesartan (AVAPRO) 300 MG tablet TAKE 1 TABLET BY MOUTH EVERY DAY 90 tablet 3  . metFORMIN (GLUCOPHAGE-XR) 500 MG 24 hr tablet TAKE 2 TABLETS BY MOUTH TWICE DAILY 360 tablet 0  . metoprolol succinate (TOPROL-XL) 25 MG 24 hr tablet TAKE 1 TABLET BY MOUTH EVERY DAY 90 tablet 3  . pioglitazone (ACTOS) 15 MG tablet TAKE 1 TABLET BY MOUTH DAILY 90 tablet 1  . fluticasone (FLONASE) 50 MCG/ACT nasal spray Place 2 sprays into both nostrils daily. (Patient not taking: Reported on 08/10/2015) 16 g 1   No facility-administered  medications prior to visit.    No Known Allergies  ROS As per HPI  PE: Blood pressure 122/82, pulse 60, temperature 97.9 F (36.6 C), temperature source Oral, resp. rate 16, height 6' 6.5" (1.994 m), weight 271 lb 8 oz (123.152 kg), SpO2 98 %. Gen: Alert, well appearing.  Patient is oriented to person, place, time, and situation. AFFECT: pleasant, lucid thought and speech. Lower legs with 2+ pitting edema bilat down into his ankles. Foot exam - bilateral normal; no swelling, tenderness or skin or vascular lesions. Color and temperature is normal. Sensation is intact. Peripheral pulses are palpable. Toenails are thickened.  LABS:  Lab Results  Component Value Date   TSH 2.03 09/15/2014   Lab Results  Component Value Date   WBC 8.4 09/15/2014   HGB 16.0 09/15/2014   HCT 46.7 09/15/2014   MCV 90.1 09/15/2014   PLT 289.0 09/15/2014   Lab Results  Component Value Date   CREATININE 0.74 09/15/2014   BUN 15 09/15/2014   NA 140 09/15/2014   K 4.3 09/15/2014   CL 104 09/15/2014   CO2 27 09/15/2014   Lab Results  Component Value Date   ALT 27 09/15/2014   AST 19 09/15/2014   ALKPHOS 53 09/15/2014   BILITOT 0.5 09/15/2014  Lab Results  Component Value Date   CHOL 114 09/15/2014   Lab Results  Component Value Date   HDL 34.40* 09/15/2014   Lab Results  Component Value Date   LDLCALC 57 09/15/2014   Lab Results  Component Value Date   TRIG 112.0 09/15/2014   Lab Results  Component Value Date   CHOLHDL 3 09/15/2014   Lab Results  Component Value Date   PSA 0.91 09/15/2014   PSA 0.68 06/15/2013   Lab Results  Component Value Date   HGBA1C 6.4 02/08/2015    IMPRESSION AND PLAN:  1) DM 2; control has historically been good.  He is going to try to start home glucose monitoring. Feet exam normal today. HbA1c today.  2) HTN; The current medical regimen is effective;  continue present plan and medications. Lytes/cr today.  3) Hypertriglyceridemia: FLP  today as well as AST/ALT. Tolerating fenofibrate well.  An After Visit Summary was printed and given to the patient.  FOLLOW UP: Return in about 4 months (around 12/11/2015) for annual CPE (fasting).  Signed:  Crissie Sickles, MD           08/10/2015

## 2015-08-10 NOTE — Addendum Note (Signed)
Addended by: Onalee Hua on: 08/10/2015 10:39 AM   Modules accepted: Orders

## 2015-08-25 DIAGNOSIS — K080 Exfoliation of teeth due to systemic causes: Secondary | ICD-10-CM | POA: Diagnosis not present

## 2015-08-31 DIAGNOSIS — E119 Type 2 diabetes mellitus without complications: Secondary | ICD-10-CM | POA: Diagnosis not present

## 2015-09-01 DIAGNOSIS — D225 Melanocytic nevi of trunk: Secondary | ICD-10-CM | POA: Diagnosis not present

## 2015-10-07 ENCOUNTER — Other Ambulatory Visit: Payer: Self-pay | Admitting: Family Medicine

## 2015-10-28 ENCOUNTER — Other Ambulatory Visit: Payer: Self-pay | Admitting: Family Medicine

## 2015-10-29 ENCOUNTER — Other Ambulatory Visit: Payer: Self-pay | Admitting: Family Medicine

## 2016-02-02 ENCOUNTER — Other Ambulatory Visit: Payer: Self-pay | Admitting: Family Medicine

## 2016-02-11 ENCOUNTER — Other Ambulatory Visit: Payer: Self-pay | Admitting: Family Medicine

## 2016-02-20 ENCOUNTER — Encounter: Payer: Self-pay | Admitting: Family Medicine

## 2016-02-20 ENCOUNTER — Ambulatory Visit (HOSPITAL_BASED_OUTPATIENT_CLINIC_OR_DEPARTMENT_OTHER)
Admission: RE | Admit: 2016-02-20 | Discharge: 2016-02-20 | Disposition: A | Discharge status: Home / Self Care | Payer: Federal, State, Local not specified - PPO | Source: Ambulatory Visit | Attending: Family Medicine | Admitting: Family Medicine

## 2016-02-20 ENCOUNTER — Ambulatory Visit (INDEPENDENT_AMBULATORY_CARE_PROVIDER_SITE_OTHER): Payer: Federal, State, Local not specified - PPO | Admitting: Family Medicine

## 2016-02-20 VITALS — BP 132/80 | HR 78 | Temp 98.7°F | Resp 16 | Ht 78.5 in | Wt 278.5 lb

## 2016-02-20 DIAGNOSIS — R072 Precordial pain: Secondary | ICD-10-CM

## 2016-02-20 DIAGNOSIS — R509 Fever, unspecified: Secondary | ICD-10-CM

## 2016-02-20 DIAGNOSIS — R079 Chest pain, unspecified: Secondary | ICD-10-CM | POA: Diagnosis not present

## 2016-02-20 DIAGNOSIS — N3001 Acute cystitis with hematuria: Secondary | ICD-10-CM | POA: Diagnosis not present

## 2016-02-20 DIAGNOSIS — R3915 Urgency of urination: Secondary | ICD-10-CM

## 2016-02-20 DIAGNOSIS — B349 Viral infection, unspecified: Secondary | ICD-10-CM

## 2016-02-20 LAB — POCT URINALYSIS DIPSTICK
BILIRUBIN UA: NEGATIVE
Ketones, UA: NEGATIVE
LEUKOCYTES UA: NEGATIVE
NITRITE UA: NEGATIVE
Protein, UA: NEGATIVE
Spec Grav, UA: 1.02
UROBILINOGEN UA: 0.2
pH, UA: 5.5

## 2016-02-20 MED ORDER — CEFDINIR 300 MG PO CAPS: 300.0000 mg | ORAL_CAPSULE | Freq: Two times a day (BID) | ORAL | 0 refills | Status: DC

## 2016-02-20 NOTE — Progress Notes (Signed)
Pre visit review using our clinic review tool, if applicable. No additional management support is needed unless otherwise documented below in the visit note. 

## 2016-02-20 NOTE — Progress Notes (Signed)
OFFICE VISIT  02/20/2016   CC:  Chief Complaint  Patient presents with  . Fever    x 2 days, with chills and body aches   HPI:    Patient is a 56 y.o. Caucasian male who presents for fever. Onset 48-72h ago with diffuse body aches, then subjective fever/chills followed.  No nasal congestion or runny nose or cough or ST.  NO HA.  Some neck aching.  No n/v/d.  Stools are normal.  No signif abd discomfort.  He did have some mild/vague discomfort/achiness over central and left chest area briefly yesterday, but overall since the day this started he says he has felt improved each day. Urinary urgency is chronic, maybe a bit more than it was?  ? Increased frequency.  No dysuria.   Took advil and it helped some. No known sick contacts.   Past Medical History:  Diagnosis Date  . Cataract   . Chronic venous insufficiency   . Diverticulitis   . 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 Zigmund Daniel in [place].  . Glaucoma   . High triglycerides   . Hypertension approx 2010  . Mood disorder ([place])    dep/anx  . Obesity, Class I, BMI 30-34.9     Past Surgical History:  Procedure Laterality Date  . ANTERIOR CRUCIATE LIGAMENT REPAIR  1985  . COLONOSCOPY  2011   diverticulosis but no polyps: recall 51yrs  . [place] SURGERY  1999  . TONSILECTOMY, ADENOIDECTOMY, BILATERAL MYRINGOTOMY AND TUBES  1966    Outpatient Medications Prior to Visit  Medication Sig Dispense Refill  . amLODipine (NORVASC) 10 MG tablet TAKE 1 TABLET BY MOUTH EVERY DAY 90 tablet 0  . fenofibrate (TRICOR) 145 MG tablet TAKE 1 TABLET BY MOUTH EVERY DAY 90 tablet 0  . glucose blood test strip Use as instructed 100 each 12  . irbesartan (AVAPRO) 300 MG tablet TAKE 1 TABLET BY MOUTH EVERY DAY 90 tablet 0  . Lancets 30G MISC Use as directed to check blood sugar 100 each 11  . metFORMIN (GLUCOPHAGE-XR) 500 MG 24 hr tablet TAKE 2 TABLETS BY MOUTH TWICE DAILY 360  tablet 0  . metoprolol succinate (TOPROL-XL) 25 MG 24 hr tablet TAKE 1 TABLET BY MOUTH EVERY DAY 90 tablet 0  . pioglitazone (ACTOS) 15 MG tablet TAKE 1 TABLET BY MOUTH DAILY 90 tablet 1   No facility-administered medications prior to visit.     No Known Allergies  ROS As per HPI  PE: Blood pressure 132/80, pulse 78, temperature 98.7 F (37.1 C), temperature source Temporal, resp. rate 16, height 6' 6.5" (1.994 m), weight 278 lb 8 oz (126.3 kg), SpO2 98 %. Gen: Alert, well appearing.  Patient is oriented to person, place, time, and situation. AFFECT: pleasant, lucid thought and speech. [id number]: no injection, icteris, swelling, or exudate.  EOMI, PERRLA. Mouth: lips without lesion/swelling.  Oral mucosa pink and moist. Oropharynx without erythema, exudate, or swelling.  Neck - No masses or thyromegaly or limitation in range of motion CV: RRR, no m/r/g.  Mild discomfort to palpation in L precordium diffusely. LUNGS: CTA bilat, nonlabored resps, good aeration in all lung fields. ABD: soft, NT, ND, BS normal.  No hepatospenomegaly or mass.  No bruits. EXT: no clubbing, cyanosis, or edema.    LABS:    Chemistry      Component Value Date/Time   NA 139 08/10/2015 0826   K 4.0 08/10/2015 [id number]  CL 105 08/10/2015 0826   CO2 27 08/10/2015 0826   BUN 12 08/10/2015 0826   CREATININE 0.83 08/10/2015 0826      Component Value Date/Time   CALCIUM 9.5 08/10/2015 0826   ALKPHOS 53 08/10/2015 0826   AST 21 08/10/2015 0826   ALT 28 08/10/2015 0826   BILITOT 0.7 08/10/2015 0826     Rapid influenza A/B: NEG  POCT UA today: 250 mg/dl glucose, trace intact blood, o/w normal  IMPRESSION AND PLAN:  Fever with body aches, vague precordial pain, and mild UTI sx's. His UA is mildly abnormal: will start abx and send urine for c/s. Will also check CXR today to eval for occult infiltrate and also check heart silhouette. Started omnicef 300 mg bid x 7d. Continue 600 mg ibuprofen tid with  food. Signs/symptoms to call or return for were reviewed and pt expressed understanding.  An After Visit Summary was printed and given to the patient.  FOLLOW UP: Return if symptoms worsen or fail to improve.  Signed:  Crissie Sickles, MD           02/20/2016

## 2016-02-22 LAB — URINE CULTURE: ORGANISM ID, BACTERIA: NO GROWTH

## 2016-03-31 ENCOUNTER — Other Ambulatory Visit: Payer: Self-pay | Admitting: Family Medicine

## 2016-04-01 NOTE — Telephone Encounter (Signed)
[facility] [place].  RF request for pioglitazone LOV: 08/10/15 Next ov: None Last written: 10/10/15 #90 w/ 1RF  Will send Rx for # 90 w/ 0RF. Pt needs office visit for f/u [facility] or CPE for more refills.

## 2016-04-01 NOTE — Telephone Encounter (Signed)
Pt advised and voiced understanding.  CPE scheduled for 05/06/16 at 8:15am.

## 2016-05-01 ENCOUNTER — Ambulatory Visit (INDEPENDENT_AMBULATORY_CARE_PROVIDER_SITE_OTHER): Payer: Federal, State, Local not specified - PPO | Admitting: Ophthalmology

## 2016-05-01 DIAGNOSIS — E113293 Type 2 diabetes mellitus with mild nonproliferative diabetic retinopathy without macular edema, bilateral: Secondary | ICD-10-CM

## 2016-05-01 DIAGNOSIS — H43813 Vitreous degeneration, bilateral: Secondary | ICD-10-CM | POA: Diagnosis not present

## 2016-05-01 DIAGNOSIS — H33302 Unspecified retinal break, left eye: Secondary | ICD-10-CM

## 2016-05-01 DIAGNOSIS — E11319 Type 2 diabetes mellitus with unspecified diabetic retinopathy without macular edema: Secondary | ICD-10-CM

## 2016-05-01 DIAGNOSIS — I10 Essential (primary) hypertension: Secondary | ICD-10-CM | POA: Diagnosis not present

## 2016-05-01 DIAGNOSIS — H35033 Hypertensive retinopathy, bilateral: Secondary | ICD-10-CM | POA: Diagnosis not present

## 2016-05-01 LAB — HM DIABETES EYE EXAM

## 2016-05-06 ENCOUNTER — Encounter: Payer: Self-pay | Admitting: Family Medicine

## 2016-05-06 ENCOUNTER — Ambulatory Visit (INDEPENDENT_AMBULATORY_CARE_PROVIDER_SITE_OTHER): Payer: Federal, State, Local not specified - PPO | Admitting: Family Medicine

## 2016-05-06 VITALS — BP 123/80 | HR 64 | Temp 97.7°F | Resp 16 | Ht 78.5 in | Wt 267.5 lb

## 2016-05-06 DIAGNOSIS — E119 Type 2 diabetes mellitus without complications: Secondary | ICD-10-CM

## 2016-05-06 DIAGNOSIS — Z0000 Encounter for general adult medical examination without abnormal findings: Secondary | ICD-10-CM

## 2016-05-06 DIAGNOSIS — Z125 Encounter for screening for malignant neoplasm of prostate: Secondary | ICD-10-CM | POA: Diagnosis not present

## 2016-05-06 LAB — CBC WITH DIFFERENTIAL/PLATELET
BASOS ABS: 0.1 10*3/uL (ref 0.0–0.1)
Basophils Relative: 0.9 % (ref 0.0–3.0)
EOS ABS: 0.2 10*3/uL (ref 0.0–0.7)
Eosinophils Relative: 2.5 % (ref 0.0–5.0)
HEMATOCRIT: 44.7 % (ref 39.0–52.0)
HEMOGLOBIN: 15.2 g/dL (ref 13.0–17.0)
LYMPHS PCT: 36.8 % (ref 12.0–46.0)
Lymphs Abs: 2.2 10*3/uL (ref 0.7–4.0)
MCHC: 34 g/dL (ref 30.0–36.0)
MCV: 91.6 fl (ref 78.0–100.0)
Monocytes Absolute: 0.4 10*3/uL (ref 0.1–1.0)
Monocytes Relative: 7.4 % (ref 3.0–12.0)
NEUTROS ABS: 3.1 10*3/uL (ref 1.4–7.7)
Neutrophils Relative %: 52.4 % (ref 43.0–77.0)
PLATELETS: 243 10*3/uL (ref 150.0–400.0)
RBC: 4.88 Mil/uL (ref 4.22–5.81)
RDW: 13.6 % (ref 11.5–15.5)
WBC: 6 10*3/uL (ref 4.0–10.5)

## 2016-05-06 LAB — COMPREHENSIVE METABOLIC PANEL
ALBUMIN: 4.6 g/dL (ref 3.5–5.2)
ALK PHOS: 55 U/L (ref 39–117)
ALT: 29 U/L (ref 0–53)
AST: 25 U/L (ref 0–37)
BILIRUBIN TOTAL: 0.9 mg/dL (ref 0.2–1.2)
BUN: 13 mg/dL (ref 6–23)
CO2: 29 mEq/L (ref 19–32)
CREATININE: 0.73 mg/dL (ref 0.40–1.50)
Calcium: 9.5 mg/dL (ref 8.4–10.5)
Chloride: 104 mEq/L (ref 96–112)
GFR: 117.62 mL/min (ref >60.00)
Glucose, Bld: 126 mg/dL — ABNORMAL HIGH (ref 70–99)
POTASSIUM: 3.9 meq/L (ref 3.5–5.1)
SODIUM: 139 meq/L (ref 135–145)
TOTAL PROTEIN: 7.1 g/dL (ref 6.0–8.3)

## 2016-05-06 LAB — LIPID PANEL
CHOLESTEROL: 110 mg/dL (ref 0–200)
HDL: 39.2 mg/dL (ref >39.00)
LDL Cholesterol: 53 mg/dL (ref 0–99)
NonHDL: 71.01
TRIGLYCERIDES: 89 mg/dL (ref 0.0–149.0)
Total CHOL/HDL Ratio: 3
VLDL: 17.8 mg/dL (ref 0.0–40.0)

## 2016-05-06 LAB — PSA: PSA: 0.76 ng/mL (ref 0.10–4.00)

## 2016-05-06 LAB — HEMOGLOBIN A1C: Hgb A1c MFr Bld: 6.1 % (ref 4.6–6.5)

## 2016-05-06 LAB — TSH: TSH: 1.52 u[IU]/mL (ref 0.35–4.50)

## 2016-05-06 MED ORDER — METOPROLOL SUCCINATE ER 25 MG PO TB24: 25.0000 mg | ORAL_TABLET | Freq: Every day | ORAL | 3 refills | Status: DC

## 2016-05-06 MED ORDER — FENOFIBRATE 145 MG PO TABS: 145.0000 mg | ORAL_TABLET | Freq: Every day | ORAL | 3 refills | Status: DC

## 2016-05-06 MED ORDER — METFORMIN HCL ER 500 MG PO TB24: 1000.0000 mg | ORAL_TABLET | Freq: Two times a day (BID) | ORAL | 3 refills | Status: DC

## 2016-05-06 MED ORDER — AMLODIPINE BESYLATE 10 MG PO TABS: 10.0000 mg | ORAL_TABLET | Freq: Every day | ORAL | 3 refills | Status: DC

## 2016-05-06 MED ORDER — IRBESARTAN 300 MG PO TABS: 300.0000 mg | ORAL_TABLET | Freq: Every day | ORAL | 3 refills | Status: DC

## 2016-05-06 NOTE — Progress Notes (Signed)
Pre visit review using our clinic review tool, if applicable. No additional management support is needed unless otherwise documented below in the visit note. 

## 2016-05-06 NOTE — Progress Notes (Signed)
Office Note 05/06/2016  CC:  Chief Complaint  Patient presents with  . Annual Exam    Pt is fasting.     HPI:  Brett Cruz is a 56 y.o. White male who is here for annual health maintenance exam. Feeling good today.  Last eye exam: w/in the last mo. Dental visits: UTD. Exercise: walking, some frisbee golf.    Past Medical History:  Diagnosis Date  . Cataract   . Chronic venous insufficiency   . Diverticulitis   . 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 Zigmund Daniel in [place].  . Glaucoma   . High triglycerides   . Hypertension approx 2010  . Mood disorder ([place])    dep/anx  . Obesity, Class I, BMI 30-34.9     Past Surgical History:  Procedure Laterality Date  . ANTERIOR CRUCIATE LIGAMENT REPAIR  1985  . COLONOSCOPY  2011   diverticulosis but no polyps: recall 72yrs  . [place] SURGERY  1999  . TONSILECTOMY, ADENOIDECTOMY, BILATERAL MYRINGOTOMY AND TUBES  1966    Family History  Problem Relation Age of Onset  . Cancer Mother   . Cancer Father   . Heart disease Father   . Heart disease Brother     Social History   Social History  . Marital status: Married    Spouse name: N/A  . Number of children: N/A  . Years of education: N/A   Occupational History  . Not on file.   Social History Main Topics  . Smoking status: Never Smoker  . Smokeless tobacco: Never Used  . Alcohol use Yes     Comment: socially  . Drug use: No  . Sexual activity: Not on file   Other Topics Concern  . Not on file   Social History Narrative   Married, 2 college age sons.   Orig from [place].   Relocated to [facility] 2013.   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   No tobacco or drugs.  Alcohol: occasional.    Outpatient Medications Prior to Visit  Medication Sig Dispense Refill  . glucose blood test strip Use as instructed 100 each 12  . Lancets 30G MISC Use as  directed to check blood sugar 100 each 11  . pioglitazone (ACTOS) 15 MG tablet TAKE 1 TABLET BY MOUTH DAILY 90 tablet 0  . amLODipine (NORVASC) 10 MG tablet TAKE 1 TABLET BY MOUTH EVERY DAY 90 tablet 0  . fenofibrate (TRICOR) 145 MG tablet TAKE 1 TABLET BY MOUTH EVERY DAY 90 tablet 0  . irbesartan (AVAPRO) 300 MG tablet TAKE 1 TABLET BY MOUTH EVERY DAY 90 tablet 0  . metFORMIN (GLUCOPHAGE-XR) 500 MG 24 hr tablet TAKE 2 TABLETS BY MOUTH TWICE DAILY 360 tablet 0  . metoprolol succinate (TOPROL-XL) 25 MG 24 hr tablet TAKE 1 TABLET BY MOUTH EVERY DAY 90 tablet 0  . cefdinir (OMNICEF) 300 MG capsule Take 1 capsule (300 mg total) by mouth 2 (two) times daily. (Patient not taking: Reported on 05/06/2016) 14 capsule 0   No facility-administered medications prior to visit.     No Known Allergies  ROS Review of Systems  Constitutional: Negative for appetite change, chills, fatigue and fever.  HENT: Negative for congestion, dental problem, ear pain and sore throat.   Eyes: Negative for discharge, redness and visual disturbance.  Respiratory: Negative for cough, chest tightness, shortness of breath and wheezing.   Cardiovascular:  Negative for chest pain, palpitations and leg swelling.  Gastrointestinal: Negative for abdominal pain, blood in stool, diarrhea, nausea and vomiting.  Genitourinary: Negative for difficulty urinating, dysuria, flank pain, frequency, hematuria and urgency.  Musculoskeletal: Negative for arthralgias, back pain, joint swelling, myalgias and neck stiffness.  Skin: Negative for pallor and rash.  Neurological: Negative for dizziness, speech difficulty, weakness and headaches.  Hematological: Negative for adenopathy. Does not bruise/bleed easily.  Psychiatric/Behavioral: Negative for confusion and sleep disturbance. The patient is not nervous/anxious.     PE; Blood pressure 123/80, pulse 64, temperature 97.7 F (36.5 C), temperature source Oral, resp. rate 16, height 6' 6.5"  (1.994 m), weight 267 lb 8 oz (121.3 kg), SpO2 97 %. Gen: Alert, well appearing.  Patient is oriented to person, place, time, and situation. AFFECT: pleasant, lucid thought and speech. ENT: Ears: EACs clear, normal epithelium.  TMs with good light reflex and landmarks bilaterally.  Eyes: no injection, icteris, swelling, or exudate.  EOMI, PERRLA. Nose: no drainage or turbinate edema/swelling.  No injection or focal lesion.  Mouth: lips without lesion/swelling.  Oral mucosa pink and moist.  Dentition intact and without obvious caries or gingival swelling.  Oropharynx without erythema, exudate, or swelling.  Neck: supple/nontender.  No LAD, mass, or TM.  Carotid pulses 2+ bilaterally, without bruits. CV: RRR, no m/r/g.   LUNGS: CTA bilat, nonlabored resps, good aeration in all lung fields. ABD: soft, NT, ND, BS normal.  No hepatospenomegaly or mass.  No bruits. EXT: no clubbing or cyanosis.  Trace LL edema bilat.  Plantar surface of R foot has two subQ nodular lesions that are rubbery and nontender, seem to be connected to flexor tendon.  Musculoskeletal: no joint swelling, erythema, warmth, or tenderness.  ROM of all joints intact. Skin - no sores or suspicious lesions or rashes or color changes. Rectal exam: negative without mass, lesions or tenderness, PROSTATE EXAM: smooth and symmetric without nodules or tenderness.   Pertinent labs:  Lab Results  Component Value Date   TSH 2.03 09/15/2014   Lab Results  Component Value Date   WBC 8.4 09/15/2014   HGB 16.0 09/15/2014   HCT 46.7 09/15/2014   MCV 90.1 09/15/2014   PLT 289.0 09/15/2014   Lab Results  Component Value Date   CREATININE 0.83 08/10/2015   BUN 12 08/10/2015   NA 139 08/10/2015   K 4.0 08/10/2015   CL 105 08/10/2015   CO2 27 08/10/2015   Lab Results  Component Value Date   ALT 28 08/10/2015   AST 21 08/10/2015   ALKPHOS 53 08/10/2015   BILITOT 0.7 08/10/2015   Lab Results  Component Value Date   CHOL 107  08/10/2015   Lab Results  Component Value Date   HDL 35.90 (L) 08/10/2015   Lab Results  Component Value Date   LDLCALC 43 08/10/2015   Lab Results  Component Value Date   TRIG 142.0 08/10/2015   Lab Results  Component Value Date   CHOLHDL 3 08/10/2015   Lab Results  Component Value Date   PSA 0.91 09/15/2014   PSA 0.68 06/15/2013   Lab Results  Component Value Date   HGBA1C 5.9 08/10/2015   ASSESSMENT AND PLAN:   Health maintenance exam: Reviewed age and gender appropriate health maintenance issues (prudent diet, regular exercise, health risks of tobacco and excessive alcohol, use of seatbelts, fire alarms in home, use of sunscreen).  Also reviewed age and gender appropriate health screening as well as vaccine recommendations. Vaccines  UTD. Fasting HP today. Prostate cancer screening: DRE normal today , PSA drawn today. Colon cancer screening: next colonoscopy due 2021.  An After Visit Summary was printed and given to the patient.  FOLLOW UP:  Return in about 6 months (around 11/05/2016) for routine chronic illness f/u.  Signed:  Crissie Sickles, MD           05/06/2016

## 2016-05-10 ENCOUNTER — Encounter: Payer: Self-pay | Admitting: Family Medicine

## 2016-05-15 DIAGNOSIS — K080 Exfoliation of teeth due to systemic causes: Secondary | ICD-10-CM | POA: Diagnosis not present

## 2016-06-25 ENCOUNTER — Other Ambulatory Visit: Payer: Self-pay | Admitting: Family Medicine

## 2016-08-23 ENCOUNTER — Telehealth: Payer: Self-pay | Admitting: Family Medicine

## 2016-08-23 ENCOUNTER — Emergency Department (HOSPITAL_BASED_OUTPATIENT_CLINIC_OR_DEPARTMENT_OTHER)
Admission: EM | Admit: 2016-08-23 | Discharge: 2016-08-23 | Disposition: A | Discharge status: Home / Self Care | Payer: Federal, State, Local not specified - PPO | Attending: Emergency Medicine | Admitting: Emergency Medicine

## 2016-08-23 ENCOUNTER — Encounter (HOSPITAL_BASED_OUTPATIENT_CLINIC_OR_DEPARTMENT_OTHER): Payer: Self-pay | Admitting: Emergency Medicine

## 2016-08-23 DIAGNOSIS — R4182 Altered mental status, unspecified: Secondary | ICD-10-CM | POA: Diagnosis present

## 2016-08-23 DIAGNOSIS — R202 Paresthesia of skin: Secondary | ICD-10-CM | POA: Diagnosis not present

## 2016-08-23 DIAGNOSIS — I10 Essential (primary) hypertension: Secondary | ICD-10-CM | POA: Diagnosis not present

## 2016-08-23 DIAGNOSIS — E119 Type 2 diabetes mellitus without complications: Secondary | ICD-10-CM | POA: Diagnosis not present

## 2016-08-23 DIAGNOSIS — Z7984 Long term (current) use of oral hypoglycemic drugs: Secondary | ICD-10-CM | POA: Diagnosis not present

## 2016-08-23 DIAGNOSIS — Z79899 Other long term (current) drug therapy: Secondary | ICD-10-CM | POA: Diagnosis not present

## 2016-08-23 DIAGNOSIS — R410 Disorientation, unspecified: Secondary | ICD-10-CM | POA: Diagnosis not present

## 2016-08-23 LAB — CBC WITH DIFFERENTIAL/PLATELET
BASOS PCT: 1 %
Basophils Absolute: 0 10*3/uL (ref 0.0–0.1)
EOS ABS: 0.1 10*3/uL (ref 0.0–0.7)
Eosinophils Relative: 1 %
HCT: 41.5 % (ref 39.0–52.0)
Hemoglobin: 14.5 g/dL (ref 13.0–17.0)
Lymphocytes Relative: 30 %
Lymphs Abs: 1.6 10*3/uL (ref 0.7–4.0)
MCH: 30.6 pg (ref 26.0–34.0)
MCHC: 34.9 g/dL (ref 30.0–36.0)
MCV: 87.6 fL (ref 78.0–100.0)
MONO ABS: 0.4 10*3/uL (ref 0.1–1.0)
MONOS PCT: 8 %
Neutro Abs: 3.2 10*3/uL (ref 1.7–7.7)
Neutrophils Relative %: 60 %
PLATELETS: 257 10*3/uL (ref 150–400)
RBC: 4.74 MIL/uL (ref 4.22–5.81)
RDW: 12.3 % (ref 11.5–15.5)
WBC: 5.3 10*3/uL (ref 4.0–10.5)

## 2016-08-23 LAB — COMPREHENSIVE METABOLIC PANEL
ALT: 20 U/L (ref 17–63)
ANION GAP: 10 (ref 5–15)
AST: 23 U/L (ref 15–41)
Albumin: 4.1 g/dL (ref 3.5–5.0)
Alkaline Phosphatase: 48 U/L (ref 38–126)
BILIRUBIN TOTAL: 1 mg/dL (ref 0.3–1.2)
BUN: 18 mg/dL (ref 6–20)
CO2: 26 mmol/L (ref 22–32)
Calcium: 9.3 mg/dL (ref 8.9–10.3)
Chloride: 104 mmol/L (ref 101–111)
Creatinine, Ser: 0.7 mg/dL (ref 0.61–1.24)
GFR calc Af Amer: >60 mL/min (ref >60)
GFR calc non Af Amer: >60 mL/min (ref >60)
GLUCOSE: 125 mg/dL — AB (ref 65–99)
POTASSIUM: 4.1 mmol/L (ref 3.5–5.1)
SODIUM: 140 mmol/L (ref 135–145)
TOTAL PROTEIN: 7.6 g/dL (ref 6.5–8.1)

## 2016-08-23 LAB — URINALYSIS, ROUTINE W REFLEX MICROSCOPIC
Bilirubin Urine: NEGATIVE
Glucose, UA: NEGATIVE mg/dL
Hgb urine dipstick: NEGATIVE
KETONES UR: NEGATIVE mg/dL
LEUKOCYTES UA: NEGATIVE
NITRITE: NEGATIVE
Protein, ur: NEGATIVE mg/dL
Specific Gravity, Urine: 1.005 (ref 1.005–1.030)
pH: 7 (ref 5.0–8.0)

## 2016-08-23 LAB — TROPONIN I: Troponin I: <0.03 ng/mL (ref <0.03)

## 2016-08-23 LAB — CBG MONITORING, ED: Glucose-Capillary: 128 mg/dL — ABNORMAL HIGH (ref 65–99)

## 2016-08-23 NOTE — Telephone Encounter (Signed)
Noted agree

## 2016-08-23 NOTE — Telephone Encounter (Signed)
Patient Name: Brett Cruz  DOB: May 08, 1960    Initial Comment tightness in chest, pressure feeling in legs, aches, some in arm, hard time focusing, at times hard to remember, and confusion, restless sleep at times, been going on a few days, not getting better    Nurse Assessment  Nurse: Cherie Dark, RN, Jarrett Soho Date/Time (Eastern Time): 08/23/2016 8:50:34 AM  Confirm and document reason for call. If symptomatic, describe symptoms. ---Caller states he is having some tightness in his chest, pressure and achiness in his legs and some confusion. Feels like he is in a fog. Has been going on for a while but has gotten worse. Trouble remembering things and focusing.  Does the patient have any new or worsening symptoms? ---Yes  Will a triage be completed? ---Yes  Related visit to physician within the last 2 weeks? ---No  Does the PT have any chronic conditions? (i.e. diabetes, asthma, etc.) ---Yes  List chronic conditions. ---diabetes, HTN,  Is this a behavioral health or substance abuse call? ---No     Guidelines    Guideline Title Affirmed Question Affirmed Notes  Chest Pain Difficult to awaken or acting confused (e.g., disoriented, slurred speech)    Final Disposition User   Call EMS 911 Now [place], RN, [employer] with [facility] on the backline and informed her of patient's refusal to call 911 or to go to the ER. Patient was made aware that the EKG machine is down and the reccomendation is to go to the ER or call 911 and patient refused and just wants to speak with the doctor.   Referrals  GO TO FACILITY REFUSED   Disagree/Comply: Disagree  Disagree/Comply Reason: Disagree with instructions

## 2016-08-23 NOTE — Telephone Encounter (Signed)
Contacted patient and advised him that Dr Anitra Lauth completely agrees with [facility] and that he needs to be evaluated by the ER right away.  Patient continued to refused ER, he doesn't have time to go to the ER, he just wants someone to check his BP.  I advised him that we can not do that, that it would be delaying his care if he is having a cardiac event.  He was very upset and stated that's great, my own Dr won't even check my BP and then said thank you and goodbye.

## 2016-08-23 NOTE — Telephone Encounter (Signed)
Pt presented at the office and asked to be seen. He completed a triage form noting that sx have been present for months and are getting worse. I spoke with Dr. Anitra Lauth and he directed pt to go to the ED for chest pain, SOB and tingling/discomfort. If anxiety related then he wants to see Mr Portner in a 30 min spot for new onset anxiety in next available opening. I have patient scheduled for Monday 7/23 at 1PM. Pt stated he did not feel well and I strongly encouraged him to [facility] the [facility] as it is the closest location for treatment.

## 2016-08-23 NOTE — ED Provider Notes (Signed)
[facility] DEPT [facility] Provider Note   CSN: [id number] Arrival date & time: 08/23/16  1000     History   Chief Complaint Chief Complaint  Patient presents with  . Altered Mental Status    HPI Brett Cruz is a 56 y.o. male.  Patient with history of diabetes presents with complaint of worsening forgetfulness, trouble focusing and anxious feeling worse for the past week. Patient states he has been having more mild symptoms over the past 1-2 months. He admits to not checking his blood sugars recently but has been taking his medications. He denies fevers, URI symptoms, nausea, vomiting, diarrhea. Patient first reports some vague chest tightness at times. This is nonexertional. No change or discontinuation of activities due to chest pain or shortness of breath. No abdominal pain. No skin rashes. No irritative bladder symptoms or increased frequency. He does report 1 isolated headache earlier in the week that is resolved. Patient denies signs of stroke including: facial droop, slurred speech, aphasia, weakness/numbness in extremities, imbalance/trouble walking. Does have some 'tingling' in bilateral feet and lower calves without swelling. Forgetfulness is described as trouble recalling things, having to think about what month it is. Wife also notes poor sleeping, going to bed at 11:30pm and awaking at 3:30am and not going back to sleep. He has been working 12 hour days for work. He states that he called his PCP and was told to come to the emergency department for further evaluation.      Past Medical History:  Diagnosis Date  . Cataract   . Chronic venous insufficiency   . Diverticulitis   . 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 Zigmund Daniel in [place].  . Glaucoma   . High triglycerides   . Hypertension approx 2010  . Hypertensive retinopathy of both eyes    Dr. Zigmund Daniel  . Mood disorder ([place])    dep/anx  . Obesity, Class  I, BMI 30-34.9     Patient Active Problem List   Diagnosis Date Noted  . Wheeze 09/23/2014  . Maxillary sinusitis 09/23/2014  . Acute bronchitis 09/23/2014  . Cough 09/23/2014  . Health maintenance examination 09/15/2014  . Local skin infection 12/17/2013  . Ankle edema 12/17/2013  . Type II or unspecified type diabetes mellitus without mention of complication, uncontrolled 06/16/2013  . Preventative health care 06/16/2013  . Hypertriglyceridemia 02/09/2013  . Chronic venous insufficiency 01/12/2013  . Prostate cancer screening 11/03/2012  . Colon cancer screening 11/03/2012  . HTN (hypertension) 09/28/2012  . Diabetes mellitus with complication ([place]) 24/40/1027    Past Surgical History:  Procedure Laterality Date  . ANTERIOR CRUCIATE LIGAMENT REPAIR  1985  . COLONOSCOPY  2011   diverticulosis but no polyps: recall 20yrs  . [place] SURGERY  1999  . TONSILECTOMY, ADENOIDECTOMY, BILATERAL MYRINGOTOMY AND TUBES  1966       Home Medications    Prior to Admission medications   Medication Sig Start Date End Date Taking? Authorizing Provider  amLODipine (NORVASC) 10 MG tablet Take 1 tablet (10 mg total) by mouth daily. 05/06/16   McGowen, Adrian Blackwater, MD  fenofibrate (TRICOR) 145 MG tablet Take 1 tablet (145 mg total) by mouth daily. 05/06/16   McGowen, Adrian Blackwater, MD  glucose blood test strip Use as instructed 04/29/13   McGowen, Adrian Blackwater, MD  irbesartan (AVAPRO) 300 MG tablet Take 1 tablet (300 mg total) by mouth daily. 05/06/16   McGowen, Adrian Blackwater, MD  Lancets  30G MISC Use as directed to check blood sugar 08/10/15   McGowen, Adrian Blackwater, MD  metFORMIN (GLUCOPHAGE-XR) 500 MG 24 hr tablet Take 2 tablets (1,000 mg total) by mouth 2 (two) times daily. 05/06/16   McGowen, Adrian Blackwater, MD  metoprolol succinate (TOPROL-XL) 25 MG 24 hr tablet Take 1 tablet (25 mg total) by mouth daily. 05/06/16   McGowen, Adrian Blackwater, MD  pioglitazone (ACTOS) 15 MG tablet TAKE 1 TABLET BY MOUTH EVERY DAY 06/26/16   McGowen,  Adrian Blackwater, MD    Family History Family History  Problem Relation Age of Onset  . Cancer Mother   . Cancer Father   . Heart disease Father   . Heart disease Brother     Social History Social History  Substance Use Topics  . Smoking status: Never Smoker  . Smokeless tobacco: Never Used  . Alcohol use Yes     Comment: socially     Allergies   Patient has no known allergies.   Review of Systems Review of Systems  Constitutional: Negative for fever.  HENT: Negative for congestion, dental problem, rhinorrhea, sinus pressure and sore throat.   Eyes: Negative for photophobia, discharge, redness and visual disturbance.  Respiratory: Positive for chest tightness. Negative for cough and shortness of breath.   Cardiovascular: Negative for chest pain.  Gastrointestinal: Negative for abdominal pain, diarrhea, nausea and vomiting.  Genitourinary: Negative for dysuria.  Musculoskeletal: Negative for gait problem, myalgias, neck pain and neck stiffness.  Skin: Negative for rash.  Neurological: Positive for numbness (paresthesias) and headaches. Negative for syncope, speech difficulty, weakness and light-headedness.  Psychiatric/Behavioral: Positive for confusion and sleep disturbance.     Physical Exam Updated Vital Signs BP (!) 153/80 (BP Location: Left Arm)   Pulse 78   Temp 98.9 F (37.2 C) (Oral)   Resp 18   Ht 6\' 7"  (2.007 m)   Wt 113.4 kg (250 lb)   SpO2 98%   BMI 28.16 kg/m   Physical Exam  Constitutional: He is oriented to person, place, and time. He appears well-developed and well-nourished.  HENT:  Head: Normocephalic and atraumatic.  Right Ear: Tympanic membrane, external ear and ear canal normal.  Left Ear: Tympanic membrane, external ear and ear canal normal.  Nose: Nose normal.  Mouth/Throat: Uvula is midline, oropharynx is clear and moist and mucous membranes are normal.  Eyes: Pupils are equal, round, and reactive to light. Conjunctivae, EOM and lids are  normal.  Neck: Normal range of motion. Neck supple.  Cardiovascular: Normal rate and regular rhythm.   Pulmonary/Chest: Effort normal and breath sounds normal.  Abdominal: Soft. There is no tenderness.  Musculoskeletal: Normal range of motion.       Cervical back: He exhibits normal range of motion, no tenderness and no bony tenderness.  Neurological: He is alert and oriented to person, place, and time. He has normal strength and normal reflexes. No cranial nerve deficit or sensory deficit. He exhibits normal muscle tone. He displays a negative Romberg sign. Coordination and gait normal. GCS eye subscore is 4. GCS verbal subscore is 5. GCS motor subscore is 6.  Skin: Skin is warm and dry.  Psychiatric: He has a normal mood and affect.  Nursing note and vitals reviewed.    ED Treatments / Results  Labs (all labs ordered are listed, but only abnormal results are displayed) Labs Reviewed  COMPREHENSIVE METABOLIC PANEL - Abnormal; Notable for the following:       Result Value   Glucose,  Bld 125 (*)    All other components within normal limits  CBG MONITORING, ED - Abnormal; Notable for the following:    Glucose-Capillary 128 (*)    All other components within normal limits  CBC WITH DIFFERENTIAL/PLATELET  URINALYSIS, ROUTINE W REFLEX MICROSCOPIC  TROPONIN I    EKG  EKG Interpretation  Date/Time:  Friday August 23 2016 10:14:35 EDT Ventricular Rate:  74 PR Interval:    QRS Duration: 117 QT Interval:  401 QTC Calculation: 445 R Axis:   -38 Text Interpretation:  Sinus rhythm No ischemia or ectopy Confirmed by Tanna Furry [phone]) on 08/23/2016 10:18:31 AM       Radiology No results found.  Procedures Procedures (including critical care time)  Medications Ordered in ED Medications - No data to display   Initial Impression / Assessment and Plan / ED Course  I have reviewed the triage vital signs and the nursing notes.  Pertinent labs & imaging results that were available  during my care of the patient were reviewed by me and considered in my medical decision making (see chart for details).     Patient seen and examined. Work-up initiated. No signs of acute stroke. Labs/UA pending. No acute indication for head CT. EKG reviewed, unremarkable.   Vital signs reviewed and are as follows: BP (!) 153/80 (BP Location: Left Arm)   Pulse 78   Temp 98.9 F (37.2 C) (Oral)   Resp 18   Ht 6\' 7"  (2.007 m)   Wt 113.4 kg (250 lb)   SpO2 98%   BMI 28.16 kg/m   12:22 PM discussed findings with patient and his wife at bedside. We discussed lifestyle modifications, good rest, and giving himself a break. He needs close PCP follow-up. If his symptoms continue to progress despite treatment, he may need specialty referral for further evaluation.  Patient counseled to return if they have weakness in their arms or legs, slurred speech, trouble walking or talking, confusion, trouble with their balance, or if they have any other concerns. Patient verbalizes understanding and agrees with plan.    Final Clinical Impressions(s) / ED Diagnoses   Final diagnoses:  Paresthesias  Confusion   Patient with gradually worsening forgetfulness and confusion. He is under a lot of stress and works long days with little sleep. This may be contributing, however patient will need close PCP follow-up for further evaluation and to ensure that symptoms are not progressing. No objective neurological deficits on exam today. EKG/troponin negative. Urine negative. Blood counts are normal. Blood sugars are reasonably controlled.  New Prescriptions New Prescriptions   No medications on file     Suann Larry 08/23/16 1223    Tanna Furry, MD 08/25/16 [phone]

## 2016-08-23 NOTE — Telephone Encounter (Signed)
TH sent this note to [place] in error; forward to [employer].

## 2016-08-23 NOTE — ED Triage Notes (Signed)
"   I called my Dr and he told me to come over here because I am not feeling well" Tingling to lower legs x 2 days and having trouble focusing and thinking off and on this week . Also has been having periods of waking up during the night and not sleeping this week.

## 2016-08-23 NOTE — Discharge Instructions (Signed)
Please read and follow all provided instructions.  Your diagnoses today include:  1. Paresthesias   2. Confusion     Tests performed today include:  Blood counts and electrolytes - blood sugars are okay  EKG and troponin - no signs of heart attack or heart trouble  Urine test - no sign of infection  Vital signs. See below for your results today.   Medications prescribed:   None  Take any prescribed medications only as directed.  Home care instructions:  Follow any educational materials contained in this packet.  BE VERY CAREFUL not to take multiple medicines containing Tylenol (also called acetaminophen). Doing so can lead to an overdose which can damage your liver and cause liver failure and possibly death.   Follow-up instructions: Please follow-up with your primary care provider in the next 3 days for further evaluation of your symptoms.   Return instructions:   Please return to the Emergency Department if you experience worsening symptoms.   Return if you have weakness in your arms or legs, slurred speech, trouble walking or talking, confusion, or trouble with your balance.   Please return if you have any other emergent concerns.  Additional Information:  Your vital signs today were: BP 139/85    Pulse 72    Temp 98.9 F (37.2 C) (Oral)    Resp 12    Ht 6\' 7"  (2.007 m)    Wt 113.4 kg (250 lb)    SpO2 96%    BMI 28.16 kg/m  If your blood pressure (BP) was elevated above 135/85 this visit, please have this repeated by your doctor within one month. --------------

## 2016-08-26 ENCOUNTER — Encounter: Payer: Self-pay | Admitting: Family Medicine

## 2016-08-26 ENCOUNTER — Ambulatory Visit (INDEPENDENT_AMBULATORY_CARE_PROVIDER_SITE_OTHER): Payer: Federal, State, Local not specified - PPO | Admitting: Family Medicine

## 2016-08-26 VITALS — BP 146/72 | HR 79 | Temp 98.3°F | Resp 16 | Ht 78.5 in | Wt 254.8 lb

## 2016-08-26 DIAGNOSIS — F320 Major depressive disorder, single episode, mild: Secondary | ICD-10-CM | POA: Diagnosis not present

## 2016-08-26 DIAGNOSIS — R4189 Other symptoms and signs involving cognitive functions and awareness: Secondary | ICD-10-CM

## 2016-08-26 DIAGNOSIS — F411 Generalized anxiety disorder: Secondary | ICD-10-CM | POA: Diagnosis not present

## 2016-08-26 MED ORDER — LORAZEPAM 0.5 MG PO TABS: ORAL_TABLET | ORAL | 1 refills | Status: DC

## 2016-08-26 MED ORDER — ESCITALOPRAM OXALATE 10 MG PO TABS: 10.0000 mg | ORAL_TABLET | Freq: Every day | ORAL | 1 refills | Status: DC

## 2016-08-26 NOTE — Progress Notes (Signed)
OFFICE VISIT  08/26/2016   CC:  Chief Complaint  Patient presents with  . Altered Mental Status  . Anxiety    ?   HPI:    Patient is a 56 y.o. Caucasian male who presents for anxiety. He presented to the ED 08/23/16 for this complaint but also had c/o chest tightness on and off, nonexertional. The evaluation in the ED showed no abnormalities on labs and EKG.  His BP was up at a check at pharmacy that day prior to going to the ED.  BP mildly up at ED 151/86.  His primary complaint is of gradually increasing "feeling like I'm in a haze" anxiety/stress over the last few months, esp the last 1-2 wks.  Forgets things easily, poor concentration, poor sleep---waking up 3-4 AM commonly--mind won't shut down.   irritable, keyed up, worries about everything.  Energy level ok. Mood is down/depressed/sad.  He is unsure if his symptoms are a result of anxiety/depression.  ROS: pressure in back of my legs, burning and tingling in lower legs of feet lately.  No HA's.  Past Medical History:  Diagnosis Date  . Cataract   . Chronic venous insufficiency   . Diverticulitis   . 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 Zigmund Daniel in [place].  . Glaucoma   . High triglycerides   . Hypertension approx 2010  . Hypertensive retinopathy of both eyes    Dr. Zigmund Daniel  . Mood disorder ([place])    dep/anx  . Obesity, Class I, BMI 30-34.9     Past Surgical History:  Procedure Laterality Date  . ANTERIOR CRUCIATE LIGAMENT REPAIR  1985  . COLONOSCOPY  2011   diverticulosis but no polyps: recall 72yrs  . [place] SURGERY  1999  . TONSILECTOMY, ADENOIDECTOMY, BILATERAL MYRINGOTOMY AND TUBES  1966    Outpatient Medications Prior to Visit  Medication Sig Dispense Refill  . amLODipine (NORVASC) 10 MG tablet Take 1 tablet (10 mg total) by mouth daily. 90 tablet 3  . fenofibrate (TRICOR) 145 MG tablet Take 1 tablet (145 mg total) by mouth daily.  90 tablet 3  . glucose blood test strip Use as instructed 100 each 12  . irbesartan (AVAPRO) 300 MG tablet Take 1 tablet (300 mg total) by mouth daily. 90 tablet 3  . Lancets 30G MISC Use as directed to check blood sugar 100 each 11  . metFORMIN (GLUCOPHAGE-XR) 500 MG 24 hr tablet Take 2 tablets (1,000 mg total) by mouth 2 (two) times daily. 360 tablet 3  . metoprolol succinate (TOPROL-XL) 25 MG 24 hr tablet Take 1 tablet (25 mg total) by mouth daily. 90 tablet 3  . pioglitazone (ACTOS) 15 MG tablet TAKE 1 TABLET BY MOUTH EVERY DAY 90 tablet 1   No facility-administered medications prior to visit.     No Known Allergies  ROS As per HPI  PE: Blood pressure (!) 146/72, pulse 79, temperature 98.3 F (36.8 C), temperature source Oral, resp. rate 16, height 6' 6.5" (1.994 m), weight 254 lb 12 oz (115.6 kg), SpO2 96 %. Gen: Alert, well appearing.  Patient is oriented to person, place, time, and situation. AFFECT: pleasant, lucid thought and speech. ENT:   Eyes: no injection, icteris, swelling, or exudate.  EOMI, PERRLA. Nose: no drainage or turbinate edema/swelling.  No injection or focal lesion.  Mouth: lips without lesion/swelling.  Oral mucosa pink and moist.  Dentition intact and without obvious caries or gingival  swelling.  Oropharynx without erythema, exudate, or swelling.  Neck: supple/nontender.  No LAD, mass, or TM.  Carotid pulses 2+ bilaterally, without bruits. CV: RRR, no m/r/g.   LUNGS: CTA bilat, nonlabored resps, good aeration in all lung fields. ABD: soft, NT/ND EXT: no clubbing or cyanosis.  1-2+ pitting edema in both LL's.  No rash. Neuro: CN 2-12 intact bilaterally, strength 5/5 in proximal and distal upper extremities and lower extremities bilaterally.    No tremor.  No ataxia.     LABS:    Chemistry      Component Value Date/Time   NA 140 08/23/2016 1039   K 4.1 08/23/2016 1039   CL 104 08/23/2016 1039   CO2 26 08/23/2016 1039   BUN 18 08/23/2016 1039    CREATININE 0.70 08/23/2016 1039      Component Value Date/Time   CALCIUM 9.3 08/23/2016 1039   ALKPHOS 48 08/23/2016 1039   AST 23 08/23/2016 1039   ALT 20 08/23/2016 1039   BILITOT 1.0 08/23/2016 1039     Lab Results  Component Value Date   WBC 5.3 08/23/2016   HGB 14.5 08/23/2016   HCT 41.5 08/23/2016   MCV 87.6 08/23/2016   PLT 257 08/23/2016   Lab Results  Component Value Date   TROPONINI <0.03 08/23/2016   EKG 08/23/16: NSR, no ischemia or ectopy.  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 I feel like his physical symptoms and cognitive problems lately are  SYMPTOMs of his anxiety and depression. Will start generic lexapro 10mg  qd.  Therapeutic expectations and side effect profile of medication discussed today.  Patient's questions answered. Also, will start lorazepam 0.5mg , 1-2 bid prn moderate/severe anxiety.  Plan is to use this sparingly, esp after SSRI begins to help.  Therapeutic expectations and side effect profile of medication discussed today.  Patient's questions answered. If he is worsening over the next month, will likely pursue brain MRI, esp if cognitive impairment is still his major concern.  Spent 25 min with pt today, with >50% of this time spent in counseling and care coordination regarding the above problems.  Reviewed ED record with pt, testing done so far, s/s of mental disorders that can lead to his symptoms, discussed possible w/u and treatments, answered pt's questions to the best of my ability.  An After Visit Summary was printed and given to the patient.  FOLLOW UP: Return in about 4 weeks (around 09/23/2016) for f/u anx/mood. Has appt with me for chronic illness f/u 11/05/16.  Signed:  Crissie Sickles, MD           08/26/2016

## 2016-09-03 DIAGNOSIS — D225 Melanocytic nevi of trunk: Secondary | ICD-10-CM | POA: Diagnosis not present

## 2016-10-04 ENCOUNTER — Other Ambulatory Visit: Payer: Self-pay | Admitting: Family Medicine

## 2016-10-16 ENCOUNTER — Other Ambulatory Visit: Payer: Self-pay | Admitting: Family Medicine

## 2016-10-16 NOTE — Telephone Encounter (Signed)
[facility]  LOV: 08/26/16 NOV: 11/05/16  Please advise. Thanks.

## 2016-10-17 NOTE — Telephone Encounter (Signed)
Rx faxed

## 2016-11-05 ENCOUNTER — Ambulatory Visit: Payer: Federal, State, Local not specified - PPO | Admitting: Family Medicine

## 2016-11-05 ENCOUNTER — Ambulatory Visit (INDEPENDENT_AMBULATORY_CARE_PROVIDER_SITE_OTHER): Payer: Federal, State, Local not specified - PPO | Admitting: Family Medicine

## 2016-11-05 ENCOUNTER — Encounter: Payer: Self-pay | Admitting: Family Medicine

## 2016-11-05 VITALS — BP 124/69 | HR 67 | Temp 98.1°F | Resp 16 | Ht 78.5 in | Wt 267.0 lb

## 2016-11-05 DIAGNOSIS — I10 Essential (primary) hypertension: Secondary | ICD-10-CM | POA: Diagnosis not present

## 2016-11-05 DIAGNOSIS — F411 Generalized anxiety disorder: Secondary | ICD-10-CM

## 2016-11-05 DIAGNOSIS — F4321 Adjustment disorder with depressed mood: Secondary | ICD-10-CM

## 2016-11-05 DIAGNOSIS — E118 Type 2 diabetes mellitus with unspecified complications: Secondary | ICD-10-CM

## 2016-11-05 LAB — POCT GLYCOSYLATED HEMOGLOBIN (HGB A1C): Hemoglobin A1C: 5.7

## 2016-11-05 MED ORDER — LORAZEPAM 0.5 MG PO TABS: ORAL_TABLET | ORAL | 2 refills | Status: DC

## 2016-11-05 MED ORDER — ESCITALOPRAM OXALATE 10 MG PO TABS
10.0000 mg | ORAL_TABLET | Freq: Every day | ORAL | 5 refills | Status: DC
Start: 2016-11-05 — End: 2017-05-08

## 2016-11-05 NOTE — Progress Notes (Deleted)
OFFICE VISIT  11/05/2016   CC: No chief complaint on file.    HPI:    Patient is a 56 y.o. Caucasian male who presents for 6 mo f/u DM 2, HTN, hypertrig, obesity.  Past Medical History:  Diagnosis Date  . Cataract   . Chronic venous insufficiency   . Diverticulitis   . 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 Zigmund Daniel in [place].  . Glaucoma   . High triglycerides   . Hypertension approx 2010  . Hypertensive retinopathy of both eyes    Dr. Zigmund Daniel  . Mood disorder ([place])    dep/anx  . Obesity, Class I, BMI 30-34.9     Past Surgical History:  Procedure Laterality Date  . ANTERIOR CRUCIATE LIGAMENT REPAIR  1985  . COLONOSCOPY  2011   diverticulosis but no polyps: recall 25yrs  . [place] SURGERY  1999  . TONSILECTOMY, ADENOIDECTOMY, BILATERAL MYRINGOTOMY AND TUBES  1966    Outpatient Medications Prior to Visit  Medication Sig Dispense Refill  . amLODipine (NORVASC) 10 MG tablet Take 1 tablet (10 mg total) by mouth daily. 90 tablet 3  . escitalopram (LEXAPRO) 10 MG tablet TAKE 1 TABLET BY MOUTH DAILY 30 tablet 0  . fenofibrate (TRICOR) 145 MG tablet Take 1 tablet (145 mg total) by mouth daily. 90 tablet 3  . glucose blood test strip Use as instructed 100 each 12  . irbesartan (AVAPRO) 300 MG tablet Take 1 tablet (300 mg total) by mouth daily. 90 tablet 3  . Lancets 30G MISC Use as directed to check blood sugar 100 each 11  . LORazepam (ATIVAN) 0.5 MG tablet TAKE 1 TO 2 TABLETS BY MOUTH TWICE DAILY AS NEEDED FOR ANXIETY 30 tablet 0  . metFORMIN (GLUCOPHAGE-XR) 500 MG 24 hr tablet Take 2 tablets (1,000 mg total) by mouth 2 (two) times daily. 360 tablet 3  . metoprolol succinate (TOPROL-XL) 25 MG 24 hr tablet Take 1 tablet (25 mg total) by mouth daily. 90 tablet 3  . pioglitazone (ACTOS) 15 MG tablet TAKE 1 TABLET BY MOUTH EVERY DAY 90 tablet 1  . pioglitazone (ACTOS) 15 MG tablet TAKE 1 TABLET BY MOUTH  EVERY DAY 90 tablet 1   No facility-administered medications prior to visit.     No Known Allergies  ROS As per HPI  PE: There were no vitals taken for this visit. ***  LABS:  Lab Results  Component Value Date   TSH 1.52 05/06/2016   Lab Results  Component Value Date   WBC 5.3 08/23/2016   HGB 14.5 08/23/2016   HCT 41.5 08/23/2016   MCV 87.6 08/23/2016   PLT 257 08/23/2016   Lab Results  Component Value Date   CREATININE 0.70 08/23/2016   BUN 18 08/23/2016   NA 140 08/23/2016   K 4.1 08/23/2016   CL 104 08/23/2016   CO2 26 08/23/2016   Lab Results  Component Value Date   ALT 20 08/23/2016   AST 23 08/23/2016   ALKPHOS 48 08/23/2016   BILITOT 1.0 08/23/2016   Lab Results  Component Value Date   CHOL 110 05/06/2016   Lab Results  Component Value Date   HDL 39.20 05/06/2016   Lab Results  Component Value Date   LDLCALC 53 05/06/2016   Lab Results  Component Value Date   TRIG 89.0 05/06/2016   Lab Results  Component Value Date   CHOLHDL 3 05/06/2016   Lab  Results  Component Value Date   PSA 0.76 05/06/2016   PSA 0.91 09/15/2014   PSA 0.68 06/15/2013   Lab Results  Component Value Date   HGBA1C 6.1 05/06/2016    IMPRESSION AND PLAN:  No problem-specific Assessment & Plan notes found for this encounter.   FOLLOW UP: No Follow-up on file.

## 2016-11-05 NOTE — Progress Notes (Signed)
Office Note 11/05/2016  CC:  Chief Complaint  Patient presents with  . Follow-up    RCI, pt is not fasting.   Not fasting today.  HPI:  Brett Cruz is a 56 y.o. White male who is here for 6 mo f/u DM 2, HTN, GAD with secondary depressed mood.  On 08/26/16, I saw him for many ongoing anxiety issues, with depressed mood as well.  I felt this was leading to the many physical and mental symptoms he was having at that time.  I started him on lexapro 10mg  qd and lorazepam prn. Says he is feeling much better, sleeping much better, seeing a life coach and trying some lifestyle changes and this is helping him feel better.  Taking lorazepam nightly.  HTN: home bp's normal. DM: no home glucose monitoring.  Compliant with meds. Feet: no tingling, numbness, or burning in feet.    L 2nd toe: stubbed it in middle of night.  It swelled and was painful and bruised.  All pain gone now but still a little swollen.  Past Medical History:  Diagnosis Date  . Cataract   . Chronic venous insufficiency   . Diverticulitis   . 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 Zigmund Daniel in [place].  . Glaucoma   . High triglycerides   . Hypertension approx 2010  . Hypertensive retinopathy of both eyes    Dr. Zigmund Daniel  . Mood disorder ([place])    dep/anx  . Obesity, Class I, BMI 30-34.9     Past Surgical History:  Procedure Laterality Date  . ANTERIOR CRUCIATE LIGAMENT REPAIR  1985  . COLONOSCOPY  2011   diverticulosis but no polyps: recall 45yrs  . [place] SURGERY  1999  . TONSILECTOMY, ADENOIDECTOMY, BILATERAL MYRINGOTOMY AND TUBES  1966    Family History  Problem Relation Age of Onset  . Cancer Mother   . Cancer Father   . Heart disease Father   . Heart disease Brother     Social History   Social History  . Marital status: Married    Spouse name: N/A  . Number of children: N/A  . Years of education: N/A   Occupational  History  . Not on file.   Social History Main Topics  . Smoking status: Never Smoker  . Smokeless tobacco: Never Used  . Alcohol use Yes     Comment: socially  . Drug use: No  . Sexual activity: Not on file   Other Topics Concern  . Not on file   Social History Narrative   Married, 2 college age sons.   Orig from [place].   Relocated to [facility] 2013.   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   No tobacco or drugs.  Alcohol: occasional.    Outpatient Medications Prior to Visit  Medication Sig Dispense Refill  . amLODipine (NORVASC) 10 MG tablet Take 1 tablet (10 mg total) by mouth daily. 90 tablet 3  . fenofibrate (TRICOR) 145 MG tablet Take 1 tablet (145 mg total) by mouth daily. 90 tablet 3  . glucose blood test strip Use as instructed 100 each 12  . irbesartan (AVAPRO) 300 MG tablet Take 1 tablet (300 mg total) by mouth daily. 90 tablet 3  . Lancets 30G MISC Use as directed to check blood sugar 100 each 11  . metFORMIN (GLUCOPHAGE-XR) 500 MG 24 hr tablet Take 2 tablets (1,000 mg total) by  mouth 2 (two) times daily. 360 tablet 3  . metoprolol succinate (TOPROL-XL) 25 MG 24 hr tablet Take 1 tablet (25 mg total) by mouth daily. 90 tablet 3  . escitalopram (LEXAPRO) 10 MG tablet TAKE 1 TABLET BY MOUTH DAILY 30 tablet 0  . LORazepam (ATIVAN) 0.5 MG tablet TAKE 1 TO 2 TABLETS BY MOUTH TWICE DAILY AS NEEDED FOR ANXIETY 30 tablet 0  . pioglitazone (ACTOS) 15 MG tablet TAKE 1 TABLET BY MOUTH EVERY DAY 90 tablet 1  . pioglitazone (ACTOS) 15 MG tablet TAKE 1 TABLET BY MOUTH EVERY DAY (Patient not taking: Reported on 11/05/2016) 90 tablet 1   No facility-administered medications prior to visit.     No Known Allergies  ROS ROS: no CP, no SOB, no dizziness.  No cognitive dysfunction.    PE; Blood pressure 124/69, pulse 67, temperature 98.1 F (36.7 C), temperature source Oral, resp. rate 16, height 6' 6.5" (1.994 m), weight 267 lb (121.1 kg), SpO2  96 %. Body mass index is 30.46 kg/m.  Gen: Alert, well appearing.  Patient is oriented to person, place, time, and situation. AFFECT: pleasant, lucid thought and speech. CV: RRR, no m/r/g.   LUNGS: CTA bilat, nonlabored resps, good aeration in all lung fields. EXT: no clubbing or cyanosis.  1+ pitting edema in both LL's, with slight freckling hemosiderin skin changes. Foot exam - bilateral normal; no swelling, tenderness or skin or vascular lesions. Color and temperature is normal. Sensation is intact. Peripheral pulses are palpable. Toenails are normal.   Pertinent labs:  Lab Results  Component Value Date   TSH 1.52 05/06/2016   Lab Results  Component Value Date   WBC 5.3 08/23/2016   HGB 14.5 08/23/2016   HCT 41.5 08/23/2016   MCV 87.6 08/23/2016   PLT 257 08/23/2016   Lab Results  Component Value Date   CREATININE 0.70 08/23/2016   BUN 18 08/23/2016   NA 140 08/23/2016   K 4.1 08/23/2016   CL 104 08/23/2016   CO2 26 08/23/2016   Lab Results  Component Value Date   ALT 20 08/23/2016   AST 23 08/23/2016   ALKPHOS 48 08/23/2016   BILITOT 1.0 08/23/2016   Lab Results  Component Value Date   CHOL 110 05/06/2016   Lab Results  Component Value Date   HDL 39.20 05/06/2016   Lab Results  Component Value Date   LDLCALC 53 05/06/2016   Lab Results  Component Value Date   TRIG 89.0 05/06/2016   Lab Results  Component Value Date   CHOLHDL 3 05/06/2016   Lab Results  Component Value Date   PSA 0.76 05/06/2016   PSA 0.91 09/15/2014   PSA 0.68 06/15/2013   Lab Results  Component Value Date   HGBA1C 5.7 11/05/2016   POC HbA1c today= 5.7 %  ASSESSMENT AND PLAN:   1) GAD with depressed mood: MUCH improved. Continue lexapro 10mg  qd, try to see if he can cut back on lorazepam use hs. Continue with life coach, TLC.  2) DM 2: stable. Feet exam normal today. Hba1c today: 5.7% today.  We'll have him stop his pioglitazone at this time.  Continue current  efforts at TLC/wt loss. He declined flu vaccine today.  3) HTN: The current medical regimen is effective;  continue present plan and medications. Lytes/cr good 08/23/16.  An After Visit Summary was printed and given to the patient.  FOLLOW UP:  Return in about 6 months (around 05/06/2017) for annual CPE (fasting).  Signed:  Crissie Sickles, MD           11/05/2016

## 2016-11-05 NOTE — Patient Instructions (Signed)
Stop pioglitazone

## 2016-11-22 DIAGNOSIS — K080 Exfoliation of teeth due to systemic causes: Secondary | ICD-10-CM | POA: Diagnosis not present

## 2017-02-13 ENCOUNTER — Telehealth: Payer: Self-pay

## 2017-02-13 MED ORDER — TELMISARTAN 80 MG PO TABS: 80.0000 mg | ORAL_TABLET | Freq: Every day | ORAL | 3 refills | Status: DC

## 2017-02-13 NOTE — Telephone Encounter (Signed)
Fax received from pharmacy stating that Irbesartan is not available with no anticipated delivery date due to back order. Change of medication is requested.

## 2017-02-13 NOTE — Telephone Encounter (Signed)
OK, telmisartan eRx'd. Stop irbesartan. -thx

## 2017-03-11 DIAGNOSIS — K080 Exfoliation of teeth due to systemic causes: Secondary | ICD-10-CM | POA: Diagnosis not present

## 2017-04-01 ENCOUNTER — Other Ambulatory Visit: Payer: Self-pay | Admitting: Family Medicine

## 2017-04-20 ENCOUNTER — Other Ambulatory Visit: Payer: Self-pay | Admitting: Family Medicine

## 2017-04-21 ENCOUNTER — Other Ambulatory Visit: Payer: Self-pay | Admitting: Family Medicine

## 2017-05-07 ENCOUNTER — Ambulatory Visit (INDEPENDENT_AMBULATORY_CARE_PROVIDER_SITE_OTHER): Payer: Federal, State, Local not specified - PPO | Admitting: Ophthalmology

## 2017-05-07 ENCOUNTER — Encounter: Payer: Self-pay | Admitting: Family Medicine

## 2017-05-07 ENCOUNTER — Encounter: Payer: Self-pay | Admitting: *Deleted

## 2017-05-07 DIAGNOSIS — H35373 Puckering of macula, bilateral: Secondary | ICD-10-CM | POA: Diagnosis not present

## 2017-05-07 DIAGNOSIS — H33302 Unspecified retinal break, left eye: Secondary | ICD-10-CM

## 2017-05-07 DIAGNOSIS — H35033 Hypertensive retinopathy, bilateral: Secondary | ICD-10-CM | POA: Diagnosis not present

## 2017-05-07 DIAGNOSIS — E113293 Type 2 diabetes mellitus with mild nonproliferative diabetic retinopathy without macular edema, bilateral: Secondary | ICD-10-CM

## 2017-05-07 DIAGNOSIS — E11319 Type 2 diabetes mellitus with unspecified diabetic retinopathy without macular edema: Secondary | ICD-10-CM

## 2017-05-07 DIAGNOSIS — I10 Essential (primary) hypertension: Secondary | ICD-10-CM

## 2017-05-07 DIAGNOSIS — H43813 Vitreous degeneration, bilateral: Secondary | ICD-10-CM

## 2017-05-07 DIAGNOSIS — F411 Generalized anxiety disorder: Secondary | ICD-10-CM | POA: Insufficient documentation

## 2017-05-07 LAB — HM DIABETES EYE EXAM

## 2017-05-08 ENCOUNTER — Ambulatory Visit (INDEPENDENT_AMBULATORY_CARE_PROVIDER_SITE_OTHER): Payer: Federal, State, Local not specified - PPO | Admitting: Family Medicine

## 2017-05-08 ENCOUNTER — Encounter: Payer: Self-pay | Admitting: Family Medicine

## 2017-05-08 VITALS — BP 134/76 | HR 63 | Temp 97.6°F | Resp 16 | Ht 79.0 in | Wt 284.2 lb

## 2017-05-08 DIAGNOSIS — Z0000 Encounter for general adult medical examination without abnormal findings: Secondary | ICD-10-CM | POA: Diagnosis not present

## 2017-05-08 DIAGNOSIS — Z79899 Other long term (current) drug therapy: Secondary | ICD-10-CM | POA: Diagnosis not present

## 2017-05-08 DIAGNOSIS — F5101 Primary insomnia: Secondary | ICD-10-CM | POA: Diagnosis not present

## 2017-05-08 DIAGNOSIS — F411 Generalized anxiety disorder: Secondary | ICD-10-CM

## 2017-05-08 DIAGNOSIS — E118 Type 2 diabetes mellitus with unspecified complications: Secondary | ICD-10-CM

## 2017-05-08 DIAGNOSIS — Z125 Encounter for screening for malignant neoplasm of prostate: Secondary | ICD-10-CM | POA: Diagnosis not present

## 2017-05-08 DIAGNOSIS — Z23 Encounter for immunization: Secondary | ICD-10-CM

## 2017-05-08 DIAGNOSIS — I10 Essential (primary) hypertension: Secondary | ICD-10-CM | POA: Diagnosis not present

## 2017-05-08 DIAGNOSIS — E781 Pure hyperglyceridemia: Secondary | ICD-10-CM

## 2017-05-08 LAB — CBC WITH DIFFERENTIAL/PLATELET
BASOS PCT: 1.2 % (ref 0.0–3.0)
Basophils Absolute: 0.1 10*3/uL (ref 0.0–0.1)
Eosinophils Absolute: 0.2 10*3/uL (ref 0.0–0.7)
Eosinophils Relative: 2.8 % (ref 0.0–5.0)
HCT: 43.9 % (ref 39.0–52.0)
Hemoglobin: 15 g/dL (ref 13.0–17.0)
LYMPHS ABS: 2.4 10*3/uL (ref 0.7–4.0)
Lymphocytes Relative: 38.8 % (ref 12.0–46.0)
MCHC: 34.2 g/dL (ref 30.0–36.0)
MCV: 90.7 fl (ref 78.0–100.0)
MONOS PCT: 7.5 % (ref 3.0–12.0)
Monocytes Absolute: 0.5 10*3/uL (ref 0.1–1.0)
NEUTROS ABS: 3.1 10*3/uL (ref 1.4–7.7)
NEUTROS PCT: 49.7 % (ref 43.0–77.0)
Platelets: 261 10*3/uL (ref 150.0–400.0)
RBC: 4.84 Mil/uL (ref 4.22–5.81)
RDW: 13.2 % (ref 11.5–15.5)
WBC: 6.2 10*3/uL (ref 4.0–10.5)

## 2017-05-08 LAB — COMPREHENSIVE METABOLIC PANEL
ALK PHOS: 68 U/L (ref 39–117)
ALT: 34 U/L (ref 0–53)
AST: 22 U/L (ref 0–37)
Albumin: 4.2 g/dL (ref 3.5–5.2)
BUN: 15 mg/dL (ref 6–23)
CHLORIDE: 103 meq/L (ref 96–112)
CO2: 27 mEq/L (ref 19–32)
Calcium: 9.8 mg/dL (ref 8.4–10.5)
Creatinine, Ser: 0.66 mg/dL (ref 0.40–1.50)
GFR: 131.66 mL/min (ref >60.00)
GLUCOSE: 154 mg/dL — AB (ref 70–99)
POTASSIUM: 4 meq/L (ref 3.5–5.1)
Sodium: 138 mEq/L (ref 135–145)
TOTAL PROTEIN: 7.1 g/dL (ref 6.0–8.3)
Total Bilirubin: 0.6 mg/dL (ref 0.2–1.2)

## 2017-05-08 LAB — LIPID PANEL
Cholesterol: 115 mg/dL (ref 0–200)
HDL: 35.6 mg/dL — AB (ref >39.00)
LDL Cholesterol: 51 mg/dL (ref 0–99)
NONHDL: 79.58
Total CHOL/HDL Ratio: 3
Triglycerides: 143 mg/dL (ref 0.0–149.0)
VLDL: 28.6 mg/dL (ref 0.0–40.0)

## 2017-05-08 LAB — HEMOGLOBIN A1C: Hgb A1c MFr Bld: 6.7 % — ABNORMAL HIGH (ref 4.6–6.5)

## 2017-05-08 LAB — PSA: PSA: 0.59 ng/mL (ref 0.10–4.00)

## 2017-05-08 LAB — TSH: TSH: 2.04 u[IU]/mL (ref 0.35–4.50)

## 2017-05-08 MED ORDER — ESCITALOPRAM OXALATE 10 MG PO TABS: 10.0000 mg | ORAL_TABLET | Freq: Every day | ORAL | 5 refills | Status: DC

## 2017-05-08 MED ORDER — LORAZEPAM 0.5 MG PO TABS: ORAL_TABLET | ORAL | 2 refills | Status: DC

## 2017-05-08 NOTE — Progress Notes (Signed)
Office Note 05/08/2017  CC:  Chief Complaint  Patient presents with  . Annual Exam    Pt is fasting.    HPI:  Brett Cruz is a 57 y.o. White male who is here for annual health maintenance exam.  HTN: has not been checking it at home.  Took meds today.  Exercise: no exercise lately. Diet: not eating well. 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  Anxiety and insomnia: using ativan usually only hs and this helps significantly. Controlled substance contract discussed today. Most recent dose of ativan was this morning, also took it last night.   Past Medical History:  Diagnosis Date  . Cataract   . Chronic venous insufficiency   . Diverticulitis   . 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 Zigmund Daniel in [place].  . Glaucoma   . High triglycerides   . Hypertension approx 2010  . Hypertensive retinopathy of both eyes    Dr. Zigmund Daniel  . Mood disorder ([place])    dep/anx  . Obesity, Class I, BMI 30-34.9     Past Surgical History:  Procedure Laterality Date  . ANTERIOR CRUCIATE LIGAMENT REPAIR  1985  . COLONOSCOPY  2011   diverticulosis but no polyps: recall 63yrs  . [place] SURGERY  1999  . TONSILECTOMY, ADENOIDECTOMY, BILATERAL MYRINGOTOMY AND TUBES  1966    Family History  Problem Relation Age of Onset  . Cancer Mother   . Cancer Father   . Heart disease Father   . Heart disease Brother     Social History   Socioeconomic History  . Marital status: Married    Spouse name: Not on file  . Number of children: Not on file  . Years of education: Not on file  . Highest education level: Not on file  Occupational History  . Not on file  Social Needs  . Financial resource strain: Not on file  . Food insecurity:    Worry: Not on file    Inability: Not on file  . Transportation needs:    Medical: Not on file    Non-medical: Not on file  Tobacco Use  . Smoking status: Never Smoker  . Smokeless  tobacco: Never Used  Substance and Sexual Activity  . Alcohol use: Yes    Comment: socially  . Drug use: No  . Sexual activity: Not on file  Lifestyle  . Physical activity:    Days per week: Not on file    Minutes per session: Not on file  . Stress: Not on file  Relationships  . Social connections:    Talks on phone: Not on file    Gets together: Not on file    Attends religious service: Not on file    Active member of club or organization: Not on file    Attends meetings of clubs or organizations: Not on file    Relationship status: Not on file  . Intimate partner violence:    Fear of current or ex partner: Not on file    Emotionally abused: Not on file    Physically abused: Not on file    Forced sexual activity: Not on file  Other Topics Concern  . Not on file  Social History Narrative   Married, 2 college age sons.   Orig from [place].   Relocated to [facility] 2013.   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   No  tobacco or drugs.  Alcohol: occasional.    Outpatient Medications Prior to Visit  Medication Sig Dispense Refill  . amLODipine (NORVASC) 10 MG tablet TAKE 1 TABLET BY MOUTH DAILY 90 tablet 0  . escitalopram (LEXAPRO) 10 MG tablet Take 1 tablet (10 mg total) by mouth daily. 30 tablet 5  . fenofibrate (TRICOR) 145 MG tablet TAKE 1 TABLET BY MOUTH DAILY 90 tablet 0  . glucose blood test strip Use as instructed 100 each 12  . irbesartan (AVAPRO) 300 MG tablet Take 1 tablet (300 mg total) by mouth daily. 90 tablet 3  . Lancets 30G MISC Use as directed to check blood sugar 100 each 11  . LORazepam (ATIVAN) 0.5 MG tablet 1-2 tabs po bid prn anxiety 60 tablet 2  . metFORMIN (GLUCOPHAGE-XR) 500 MG 24 hr tablet TAKE 2 TABLETS BY MOUTH TWICE DAILY 360 tablet 0  . metoprolol succinate (TOPROL-XL) 25 MG 24 hr tablet TAKE 1 TABLET BY MOUTH DAILY 90 tablet 0  . telmisartan (MICARDIS) 80 MG tablet Take 1 tablet (80 mg total) by mouth daily. 90  tablet 3   No facility-administered medications prior to visit.     No Known Allergies  ROS Review of Systems  Constitutional: Negative for appetite change, chills, fatigue and fever.  HENT: Negative for congestion, dental problem, ear pain and sore throat.   Eyes: Negative for discharge, redness and visual disturbance.  Respiratory: Negative for cough, chest tightness, shortness of breath and wheezing.   Cardiovascular: Negative for chest pain, palpitations and leg swelling.  Gastrointestinal: Negative for abdominal pain, blood in stool, diarrhea, nausea and vomiting.  Genitourinary: Negative for difficulty urinating, dysuria, flank pain, frequency, hematuria and urgency.  Musculoskeletal: Negative for arthralgias, back pain, joint swelling, myalgias and neck stiffness.  Skin: Negative for pallor and rash.  Neurological: Negative for dizziness, speech difficulty, weakness and headaches.  Hematological: Negative for adenopathy. Does not bruise/bleed easily.  Psychiatric/Behavioral: Negative for confusion and sleep disturbance. The patient is not nervous/anxious.     PE; Initial bp today 153/79.  Repeat was 134/76. Blood pressure 134/76, pulse 63, temperature 97.6 F (36.4 C), temperature source Oral, resp. rate 16, height 6\' 7"  (2.007 m), weight 284 lb 4 oz (128.9 kg), SpO2 98 %. Gen: Alert, well appearing.  Patient is oriented to person, place, time, and situation. AFFECT: pleasant, lucid thought and speech. ENT: Ears: EACs clear, normal epithelium.  TMs with good light reflex and landmarks bilaterally.  Eyes: no injection, icteris, swelling, or exudate.  EOMI, PERRLA. Nose: no drainage or turbinate edema/swelling.  No injection or focal lesion.  Mouth: lips without lesion/swelling.  Oral mucosa pink and moist.  Dentition intact and without obvious caries or gingival swelling.  Oropharynx without erythema, exudate, or swelling.  Neck: supple/nontender.  No LAD, mass, or TM.  Carotid  pulses 2+ bilaterally, without bruits. CV: RRR, no m/r/g.   LUNGS: CTA bilat, nonlabored resps, good aeration in all lung fields. ABD: soft, NT, ND, BS normal.  No hepatospenomegaly or mass.  No bruits. EXT: no clubbing, cyanosis, or edema.  Musculoskeletal: no joint swelling, erythema, warmth, or tenderness.  ROM of all joints intact. Skin - no sores or suspicious lesions or rashes or color changes Rectal exam: negative without mass, lesions or tenderness, PROSTATE EXAM: smooth and symmetric without nodules or tenderness.   Pertinent labs:  Lab Results  Component Value Date   TSH 1.52 05/06/2016   Lab Results  Component Value Date   WBC 5.3 08/23/2016  HGB 14.5 08/23/2016   HCT 41.5 08/23/2016   MCV 87.6 08/23/2016   PLT 257 08/23/2016   Lab Results  Component Value Date   CREATININE 0.70 08/23/2016   BUN 18 08/23/2016   NA 140 08/23/2016   K 4.1 08/23/2016   CL 104 08/23/2016   CO2 26 08/23/2016   Lab Results  Component Value Date   ALT 20 08/23/2016   AST 23 08/23/2016   ALKPHOS 48 08/23/2016   BILITOT 1.0 08/23/2016   Lab Results  Component Value Date   CHOL 110 05/06/2016   Lab Results  Component Value Date   HDL 39.20 05/06/2016   Lab Results  Component Value Date   LDLCALC 53 05/06/2016   Lab Results  Component Value Date   TRIG 89.0 05/06/2016   Lab Results  Component Value Date   CHOLHDL 3 05/06/2016   Lab Results  Component Value Date   PSA 0.76 05/06/2016   PSA 0.91 09/15/2014   PSA 0.68 06/15/2013   Lab Results  Component Value Date   HGBA1C 5.7 11/05/2016     ASSESSMENT AND PLAN:   1) GAD/insomnia/high risk med use (lorazepam). Controlled substance contract reviewed with patient today.  Patient signed this and it will be placed in the chart.   UDS today (should see lorazepam only).  Lorazepam rx faxed today: 0.5mg , 1-2 bid prn, #60, RF x 2.  2) Health maintenance exam: Reviewed age and gender appropriate health maintenance  issues (prudent diet, regular exercise, health risks of tobacco and excessive alcohol, use of seatbelts, fire alarms in home, use of sunscreen).  Also reviewed age and gender appropriate health screening as well as vaccine recommendations. Vaccines: UTD.  Shingrix discussed--#1 given today. Labs: fasting HP, PSA, A1c. Prostate ca screening: DRE normal , PSA. Colon ca screening: next colonoscopy due 2021.  An After Visit Summary was printed and given to the patient.  FOLLOW UP:  Return in about 3 months (around 08/07/2017) for routine chronic illness f/u.   Signed:  Crissie Sickles, MD           05/08/2017

## 2017-05-08 NOTE — Patient Instructions (Signed)

## 2017-05-08 NOTE — Addendum Note (Signed)
Addended by: Onalee Hua on: 05/08/2017 09:04 AM   Modules accepted: Orders

## 2017-05-11 LAB — PAIN MGMT, PROFILE 8 W/CONF, U
6 Acetylmorphine: NEGATIVE ng/mL (ref <10)
ALPHAHYDROXYALPRAZOLAM: NEGATIVE ng/mL (ref <25)
ALPHAHYDROXYMIDAZOLAM: NEGATIVE ng/mL (ref <50)
ALPHAHYDROXYTRIAZOLAM: NEGATIVE ng/mL (ref <50)
AMPHETAMINES: NEGATIVE ng/mL (ref <500)
Alcohol Metabolites: NEGATIVE ng/mL (ref <500)
Aminoclonazepam: NEGATIVE ng/mL (ref <25)
BENZODIAZEPINES: POSITIVE ng/mL — AB (ref <100)
Buprenorphine, Urine: NEGATIVE ng/mL (ref <5)
CREATININE: 72.4 mg/dL
Cocaine Metabolite: NEGATIVE ng/mL (ref <150)
HYDROXYETHYLFLURAZEPAM: NEGATIVE ng/mL (ref <50)
Lorazepam: 122 ng/mL — ABNORMAL HIGH (ref <50)
MARIJUANA METABOLITE: NEGATIVE ng/mL (ref <20)
MDA: NEGATIVE ng/mL (ref <200)
MDMA: NEGATIVE ng/mL (ref <200)
MDMA: NEGATIVE ng/mL (ref <500)
NORDIAZEPAM: NEGATIVE ng/mL (ref <50)
OXAZEPAM: NEGATIVE ng/mL (ref <50)
OXIDANT: NEGATIVE ug/mL (ref <200)
OXYCODONE: NEGATIVE ng/mL (ref <100)
Opiates: NEGATIVE ng/mL (ref <100)
PH: 6.34 (ref 4.5–9.0)
Temazepam: NEGATIVE ng/mL (ref <50)

## 2017-07-17 ENCOUNTER — Other Ambulatory Visit: Payer: Self-pay | Admitting: Family Medicine

## 2017-07-21 ENCOUNTER — Other Ambulatory Visit: Payer: Self-pay | Admitting: Family Medicine

## 2017-08-19 ENCOUNTER — Ambulatory Visit (INDEPENDENT_AMBULATORY_CARE_PROVIDER_SITE_OTHER): Payer: Federal, State, Local not specified - PPO | Admitting: Family Medicine

## 2017-08-19 ENCOUNTER — Encounter: Payer: Self-pay | Admitting: Family Medicine

## 2017-08-19 VITALS — BP 137/80 | HR 70 | Temp 97.5°F | Resp 16 | Ht 79.0 in | Wt 285.2 lb

## 2017-08-19 DIAGNOSIS — I10 Essential (primary) hypertension: Secondary | ICD-10-CM | POA: Diagnosis not present

## 2017-08-19 DIAGNOSIS — Z23 Encounter for immunization: Secondary | ICD-10-CM | POA: Diagnosis not present

## 2017-08-19 DIAGNOSIS — E118 Type 2 diabetes mellitus with unspecified complications: Secondary | ICD-10-CM

## 2017-08-19 DIAGNOSIS — F419 Anxiety disorder, unspecified: Secondary | ICD-10-CM

## 2017-08-19 DIAGNOSIS — E669 Obesity, unspecified: Secondary | ICD-10-CM | POA: Diagnosis not present

## 2017-08-19 LAB — POCT GLYCOSYLATED HEMOGLOBIN (HGB A1C): HBA1C, POC (CONTROLLED DIABETIC RANGE): 7.1 % — AB (ref 0.0–7.0)

## 2017-08-19 NOTE — Patient Instructions (Signed)
Take over the counter generic zantac 150mg , 1 tab twice daily, for 2 weeks.

## 2017-08-19 NOTE — Addendum Note (Signed)
Addended by: Gordy Councilman on: 08/19/2017 08:58 AM   Modules accepted: Orders

## 2017-08-19 NOTE — Progress Notes (Signed)
OFFICE VISIT  08/19/2017   CC:  Chief Complaint  Patient presents with  . Follow-up    RCI, pt is fasting.    HPI:    Patient is a 57 y.o. Caucasian male who presents for 3 mo f/u HTN, DM 2, chronic anxiety with hx of panic. Also here for shingrix #2.  DM: still not eating well, has gained a little weight.   Exercise: walking in the pool last 2 weeks now. No home gluc monitoring. HTN: no home bp monitoring.  Anxiety: seems to be stable on current meds. He has some help at his job now so that has helped a lot.  ROS: a few days of epigastric burning pain recently, lots of burping, T 101.  No resp sx's, no diarrhea.  No n/v. No melena.  Mild sx's lingering still but "a lot better".  He took some tums--? Improved.  Past Medical History:  Diagnosis Date  . Cataract   . Chronic venous insufficiency   . Diverticulitis   . 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 Zigmund Daniel in [place].  . Glaucoma   . High triglycerides   . Hypertension approx 2010  . Hypertensive retinopathy of both eyes    Dr. Zigmund Daniel  . Mood disorder ([place])    dep/anx  . Obesity, Class I, BMI 30-34.9     Past Surgical History:  Procedure Laterality Date  . ANTERIOR CRUCIATE LIGAMENT REPAIR  1985  . COLONOSCOPY  2011   diverticulosis but no polyps: recall 31yrs  . [place] SURGERY  1999  . TONSILECTOMY, ADENOIDECTOMY, BILATERAL MYRINGOTOMY AND TUBES  1966    Outpatient Medications Prior to Visit  Medication Sig Dispense Refill  . amLODipine (NORVASC) 10 MG tablet TAKE 1 TABLET BY MOUTH DAILY 90 tablet 1  . escitalopram (LEXAPRO) 10 MG tablet Take 1 tablet (10 mg total) by mouth daily. 30 tablet 5  . fenofibrate (TRICOR) 145 MG tablet TAKE 1 TABLET BY MOUTH DAILY 90 tablet 1  . glucose blood test strip Use as instructed 100 each 12  . Lancets 30G MISC Use as directed to check blood sugar 100 each 11  . LORazepam (ATIVAN) 0.5 MG tablet  1-2 tabs po bid prn anxiety 60 tablet 2  . metFORMIN (GLUCOPHAGE-XR) 500 MG 24 hr tablet TAKE 2 TABLETS BY MOUTH TWICE DAILY 360 tablet 1  . metoprolol succinate (TOPROL-XL) 25 MG 24 hr tablet TAKE 1 TABLET BY MOUTH DAILY 90 tablet 1  . telmisartan (MICARDIS) 80 MG tablet Take 1 tablet (80 mg total) by mouth daily. 90 tablet 3   No facility-administered medications prior to visit.     No Known Allergies  ROS As per HPI  PE: Blood pressure 137/80, pulse 70, temperature (!) 97.5 F (36.4 C), temperature source Oral, resp. rate 16, height 6\' 7"  (2.007 m), weight 285 lb 4 oz (129.4 kg), SpO2 98 %. Body mass index is 32.13 kg/m.  Gen: Alert, well appearing.  Patient is oriented to person, place, time, and situation. AFFECT: pleasant, lucid thought and speech. ABD: soft, NT/ND   LABS:  Lab Results  Component Value Date   TSH 2.04 05/08/2017   Lab Results  Component Value Date   WBC 6.2 05/08/2017   HGB 15.0 05/08/2017   HCT 43.9 05/08/2017   MCV 90.7 05/08/2017   PLT 261.0 05/08/2017   Lab Results  Component Value Date   CREATININE 0.66 05/08/2017   BUN  15 05/08/2017   NA 138 05/08/2017   K 4.0 05/08/2017   CL 103 05/08/2017   CO2 27 05/08/2017   Lab Results  Component Value Date   ALT 34 05/08/2017   AST 22 05/08/2017   ALKPHOS 68 05/08/2017   BILITOT 0.6 05/08/2017   Lab Results  Component Value Date   CHOL 115 05/08/2017   Lab Results  Component Value Date   HDL 35.60 (L) 05/08/2017   Lab Results  Component Value Date   LDLCALC 51 05/08/2017   Lab Results  Component Value Date   TRIG 143.0 05/08/2017   Lab Results  Component Value Date   CHOLHDL 3 05/08/2017   Lab Results  Component Value Date   PSA 0.59 05/08/2017   PSA 0.76 05/06/2016   PSA 0.91 09/15/2014   Lab Results  Component Value Date   HGBA1C 6.7 (H) 05/08/2017   POC A1c today= 7.1  IMPRESSION AND PLAN:  1) DM 2:  Control worsening a little b/c of poor diet and exercise  habits/wt gain. We discussed getting more aggressive with these measures, no new meds today.  2) HTN: The current medical regimen is effective;  continue present plan and medications. Lytes/cr normal 3 mo ago.  3) Chronic anxiety w/hx of panic: stable on lexapro and prn lorazepam. UDS and CSC UTD.  An After Visit Summary was printed and given to the patient.  FOLLOW UP: Return in about 3 months (around 11/19/2017) for routine chronic illness f/u.  Signed:  Crissie Sickles, MD           08/19/2017

## 2017-09-16 DIAGNOSIS — D225 Melanocytic nevi of trunk: Secondary | ICD-10-CM | POA: Diagnosis not present

## 2017-10-25 ENCOUNTER — Other Ambulatory Visit: Payer: Self-pay | Admitting: Family Medicine

## 2017-10-27 NOTE — Telephone Encounter (Signed)
LOV: 08/19/17 NOV: 12/03/17  RF request for lexapro Last written: 05/08/17 #30 w/ 5RF  RF request for lorazepam Last written: 05/08/17 #60 w/ 2RF  Please advise. Thanks.

## 2017-12-03 ENCOUNTER — Ambulatory Visit: Payer: Federal, State, Local not specified - PPO | Admitting: Family Medicine

## 2017-12-03 ENCOUNTER — Encounter: Payer: Self-pay | Admitting: Family Medicine

## 2017-12-03 VITALS — BP 130/80 | HR 70 | Temp 97.7°F | Resp 16 | Ht 79.0 in | Wt 285.1 lb

## 2017-12-03 DIAGNOSIS — E669 Obesity, unspecified: Secondary | ICD-10-CM | POA: Diagnosis not present

## 2017-12-03 DIAGNOSIS — F32A Depression, unspecified: Secondary | ICD-10-CM

## 2017-12-03 DIAGNOSIS — F329 Major depressive disorder, single episode, unspecified: Secondary | ICD-10-CM

## 2017-12-03 DIAGNOSIS — F419 Anxiety disorder, unspecified: Secondary | ICD-10-CM

## 2017-12-03 DIAGNOSIS — E118 Type 2 diabetes mellitus with unspecified complications: Secondary | ICD-10-CM | POA: Diagnosis not present

## 2017-12-03 DIAGNOSIS — I10 Essential (primary) hypertension: Secondary | ICD-10-CM

## 2017-12-03 LAB — BASIC METABOLIC PANEL
BUN: 15 mg/dL (ref 6–23)
CALCIUM: 9.6 mg/dL (ref 8.4–10.5)
CO2: 28 meq/L (ref 19–32)
Chloride: 103 mEq/L (ref 96–112)
Creatinine, Ser: 0.73 mg/dL (ref 0.40–1.50)
GFR: 116.97 mL/min (ref >60.00)
GLUCOSE: 175 mg/dL — AB (ref 70–99)
POTASSIUM: 4.3 meq/L (ref 3.5–5.1)
Sodium: 139 mEq/L (ref 135–145)

## 2017-12-03 LAB — HEMOGLOBIN A1C: Hgb A1c MFr Bld: 7.5 % — ABNORMAL HIGH (ref 4.6–6.5)

## 2017-12-03 NOTE — Progress Notes (Addendum)
OFFICE VISIT  12/03/2017   CC:  Chief Complaint  Patient presents with  . Follow-up    RCI, pt is fasting.     HPI:    Patient is a 57 y.o. Caucasian male who presents for 3 mo f/u DM 2, HTN, hypertriglyceridemia, and dep/anx.  DM: no home glucose checks.  No polyuria or polydipsia. Feet: no burning, tingling, or numbness in feet. Diet not going too well, no exercise-->just too busy with work lately.  HTN: working 7 d/week and stressed.  Compliant with meds.  Dep/anx: doing well on lexapro--mood stable, sleep good, memory improved.  Taking lorazepam nightly.  ROS: no CP, no SOB, no wheezing, no cough, no dizziness, no HAs, no rashes, no melena/hematochezia.  No polyuria or polydipsia.    Past Medical History:  Diagnosis Date  . Cataract   . Chronic venous insufficiency   . Diverticulitis   . 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 Zigmund Daniel in [place].  . Glaucoma   . High triglycerides   . Hypertension approx 2010  . Hypertensive retinopathy of both eyes    Dr. Zigmund Daniel  . Mood disorder ([place])    dep/anx  . Obesity, Class I, BMI 30-34.9     Past Surgical History:  Procedure Laterality Date  . ANTERIOR CRUCIATE LIGAMENT REPAIR  1985  . COLONOSCOPY  2011   diverticulosis but no polyps: recall 23yrs  . [place] SURGERY  1999  . TONSILECTOMY, ADENOIDECTOMY, BILATERAL MYRINGOTOMY AND TUBES  1966    Outpatient Medications Prior to Visit  Medication Sig Dispense Refill  . amLODipine (NORVASC) 10 MG tablet TAKE 1 TABLET BY MOUTH DAILY 90 tablet 1  . aspirin EC 81 MG tablet Take 81 mg by mouth daily.    Marland Kitchen escitalopram (LEXAPRO) 10 MG tablet TAKE 1 TABLET BY MOUTH DAILY 30 tablet 5  . fenofibrate (TRICOR) 145 MG tablet TAKE 1 TABLET BY MOUTH DAILY 90 tablet 1  . GLUCOSAMINE-CHONDROITIN PO Take 1 capsule by mouth daily.    Marland Kitchen glucose blood test strip Use as instructed 100 each 12  . Lancets 30G MISC Use  as directed to check blood sugar 100 each 11  . LORazepam (ATIVAN) 0.5 MG tablet TAKE 1-2 TABLETS BY MOUTH TWICE DAILY AS NEEDED FOR ANXIETY 60 tablet 5  . metFORMIN (GLUCOPHAGE-XR) 500 MG 24 hr tablet TAKE 2 TABLETS BY MOUTH TWICE DAILY 360 tablet 1  . metoprolol succinate (TOPROL-XL) 25 MG 24 hr tablet TAKE 1 TABLET BY MOUTH DAILY 90 tablet 1  . Omega-3 Fatty Acids (FISH OIL PO) Take 1 capsule by mouth daily.    Marland Kitchen telmisartan (MICARDIS) 80 MG tablet Take 1 tablet (80 mg total) by mouth daily. 90 tablet 3   No facility-administered medications prior to visit.     No Known Allergies  ROS As per HPI  PE: Blood pressure 130/80, pulse 70, temperature 97.7 F (36.5 C), temperature source Oral, resp. rate 16, height 6\' 7"  (2.007 m), weight 285 lb 2 oz (129.3 kg), SpO2 98 %. Gen: Alert, well appearing.  Patient is oriented to person, place, time, and situation. AFFECT: pleasant, lucid thought and speech. Foot exam - both normal; no swelling, tenderness or skin or vascular lesions. Color and temperature is normal. Sensation is intact. Peripheral pulses are palpable. Toenails are normal.   LABS:  Lab Results  Component Value Date   TSH 2.04 05/08/2017   Lab Results  Component Value  Date   WBC 6.2 05/08/2017   HGB 15.0 05/08/2017   HCT 43.9 05/08/2017   MCV 90.7 05/08/2017   PLT 261.0 05/08/2017   Lab Results  Component Value Date   CREATININE 0.66 05/08/2017   BUN 15 05/08/2017   NA 138 05/08/2017   K 4.0 05/08/2017   CL 103 05/08/2017   CO2 27 05/08/2017   Lab Results  Component Value Date   ALT 34 05/08/2017   AST 22 05/08/2017   ALKPHOS 68 05/08/2017   BILITOT 0.6 05/08/2017   Lab Results  Component Value Date   CHOL 115 05/08/2017   Lab Results  Component Value Date   HDL 35.60 (L) 05/08/2017   Lab Results  Component Value Date   LDLCALC 51 05/08/2017   Lab Results  Component Value Date   TRIG 143.0 05/08/2017   Lab Results  Component Value Date    CHOLHDL 3 05/08/2017   Lab Results  Component Value Date   PSA 0.59 05/08/2017   PSA 0.76 05/06/2016   PSA 0.91 09/15/2014   Lab Results  Component Value Date   HGBA1C 7.1 (A) 08/19/2017    IMPRESSION AND PLAN:  1) DM 2, noncompliant with diet/exercise and home monitoring. HbA1c today. Feet exam normal today.  2) HTN: The current medical regimen is effective;  continue present plan and medications. Needs to do a bit more home monitoring. No change in med today. BMET.  3) Anx/dep: doing well on lexapro and ativan qd.  An After Visit Summary was printed and given to the patient.  FOLLOW UP: Return in about 3 months (around 03/05/2018).  Signed:  Crissie Sickles, MD           12/03/2017  At the end of today's visit, pt stated that next time he may want a referral to a back doctor b/c his back pain has gradually been returning and is persistently bothering him, with hip pain and some leg numbness and tingling on same side. He does not want to pursue this right now, though---but next visit.

## 2018-01-16 ENCOUNTER — Other Ambulatory Visit: Payer: Self-pay | Admitting: Family Medicine

## 2018-01-16 NOTE — Telephone Encounter (Signed)
No future apt scheduled.   Pt was advised that he will be due for f/u in January. I asked pt if he would like to schedule f/u now, pt declined, stated that he will call back to schedule f/u.

## 2018-02-24 ENCOUNTER — Encounter: Payer: Self-pay | Admitting: Family Medicine

## 2018-02-24 ENCOUNTER — Ambulatory Visit: Payer: Federal, State, Local not specified - PPO | Admitting: Family Medicine

## 2018-02-24 VITALS — BP 128/80 | HR 74 | Temp 98.9°F | Resp 16 | Ht 79.0 in | Wt 278.5 lb

## 2018-02-24 DIAGNOSIS — F411 Generalized anxiety disorder: Secondary | ICD-10-CM | POA: Diagnosis not present

## 2018-02-24 DIAGNOSIS — I10 Essential (primary) hypertension: Secondary | ICD-10-CM

## 2018-02-24 DIAGNOSIS — E119 Type 2 diabetes mellitus without complications: Secondary | ICD-10-CM | POA: Diagnosis not present

## 2018-02-24 LAB — POCT GLYCOSYLATED HEMOGLOBIN (HGB A1C): Hemoglobin A1C: 8.3 % — AB (ref 4.0–5.6)

## 2018-02-24 NOTE — Progress Notes (Signed)
OFFICE VISIT  02/24/2018   CC:  Chief Complaint  Patient presents with  . Follow-up    RCI, pt is fasting.      HPI:    Patient is a 58 y.o. Caucasian male who presents for 3 mo f/u DM2, HTN, and anxiety.  DM:  He did have a recent 3 week respiratory illness in which he took a lot of otc cough meds. Also, has been doing some holiday eating. No home glucose monitoring.  HTN: home bp occ checked --all < 130/80.  Anxiety: doing well, takes one lorazepam nightly.  Four days ago he got acute R LB pain at kidney level and R LQ pain, subjective f/c, malaise. He did have urinary frequency the first night.  Urine was a little darker.  Sx's resolved in 2d.    Past Medical History:  Diagnosis Date  . Cataract   . Chronic venous insufficiency   . Diverticulitis   . 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 Zigmund Daniel in [place].  . Glaucoma   . High triglycerides   . Hypertension approx 2010  . Hypertensive retinopathy of both eyes    Dr. Zigmund Daniel  . Mood disorder ([place])    dep/anx  . Obesity, Class I, BMI 30-34.9     Past Surgical History:  Procedure Laterality Date  . ANTERIOR CRUCIATE LIGAMENT REPAIR  1985  . COLONOSCOPY  2011   diverticulosis but no polyps: recall 40yrs  . [place] SURGERY  1999  . TONSILECTOMY, ADENOIDECTOMY, BILATERAL MYRINGOTOMY AND TUBES  1966    Outpatient Medications Prior to Visit  Medication Sig Dispense Refill  . amLODipine (NORVASC) 10 MG tablet TAKE 1 TABLET BY MOUTH DAILY 90 tablet 0  . aspirin EC 81 MG tablet Take 81 mg by mouth daily.    Marland Kitchen escitalopram (LEXAPRO) 10 MG tablet TAKE 1 TABLET BY MOUTH DAILY 30 tablet 5  . fenofibrate (TRICOR) 145 MG tablet TAKE 1 TABLET BY MOUTH DAILY 90 tablet 0  . GLUCOSAMINE-CHONDROITIN PO Take 1 capsule by mouth daily.    Marland Kitchen glucose blood test strip Use as instructed 100 each 12  . Lancets 30G MISC Use as directed to check blood sugar 100  each 11  . LORazepam (ATIVAN) 0.5 MG tablet TAKE 1-2 TABLETS BY MOUTH TWICE DAILY AS NEEDED FOR ANXIETY 60 tablet 5  . metFORMIN (GLUCOPHAGE-XR) 500 MG 24 hr tablet TAKE 2 TABLETS BY MOUTH TWICE DAILY 360 tablet 0  . metoprolol succinate (TOPROL-XL) 25 MG 24 hr tablet TAKE 1 TABLET BY MOUTH DAILY 90 tablet 0  . Omega-3 Fatty Acids (FISH OIL PO) Take 1 capsule by mouth daily.    Marland Kitchen telmisartan (MICARDIS) 80 MG tablet TAKE 1 TABLET BY MOUTH DAILY 90 tablet 0   No facility-administered medications prior to visit.     No Known Allergies  ROS As per HPI  PE: Blood pressure 128/80, pulse 74, temperature 98.9 F (37.2 C), temperature source Oral, resp. rate 16, height 6\' 7"  (2.007 m), weight 278 lb 8 oz (126.3 kg), SpO2 98 %. Gen: Alert, well appearing.  Patient is oriented to person, place, time, and situation. AFFECT: pleasant, lucid thought and speech. CV: RRR, no m/r/g.   LUNGS: CTA bilat, nonlabored resps, good aeration in all lung fields. ABD: soft, NT, ND, BS normal.  No hepatospenomegaly or mass.  No bruits. BACK: no TTP, no CVA TTP  LABS:  Lab Results  Component Value  Date   TSH 2.04 05/08/2017   Lab Results  Component Value Date   WBC 6.2 05/08/2017   HGB 15.0 05/08/2017   HCT 43.9 05/08/2017   MCV 90.7 05/08/2017   PLT 261.0 05/08/2017   Lab Results  Component Value Date   CREATININE 0.73 12/03/2017   BUN 15 12/03/2017   NA 139 12/03/2017   K 4.3 12/03/2017   CL 103 12/03/2017   CO2 28 12/03/2017   Lab Results  Component Value Date   ALT 34 05/08/2017   AST 22 05/08/2017   ALKPHOS 68 05/08/2017   BILITOT 0.6 05/08/2017   Lab Results  Component Value Date   CHOL 115 05/08/2017   Lab Results  Component Value Date   HDL 35.60 (L) 05/08/2017   Lab Results  Component Value Date   LDLCALC 51 05/08/2017   Lab Results  Component Value Date   TRIG 143.0 05/08/2017   Lab Results  Component Value Date   CHOLHDL 3 05/08/2017   Lab Results  Component  Value Date   PSA 0.59 05/08/2017   PSA 0.76 05/06/2016   PSA 0.91 09/15/2014   Lab Results  Component Value Date   HGBA1C 8.3 (A) 02/24/2018   POC A1c is 8.3%  IMPRESSION AND PLAN:  1) DM 2: control gradually worsening over the last 7-8 months.  Diet not great, nor are his exercise habits. Pt declines any new meds at this time. He will start (he has never checked glucoses at home since dx of DM) checking his glucoses daily and we'll review these in 1 month f/u.  Normal fasting and 2H pp glucose ranges discussed today.  2) HTN: The current medical regimen is effective;  continue present plan and medications. BMET normal 3 mo ago.  3) GAD: doing well on lexapro and hs lorazepam. CSC and UDS UTD.  4) Recent febrile illness with R CVA and R LQ pain-->completely resolved now. ? Kidney stone.   Watchful waiting approach.  An After Visit Summary was printed and given to the patient.  FOLLOW UP: Return in about 4 weeks (around 03/24/2018) for f/u DM.  Signed:  Crissie Sickles, MD           02/24/2018

## 2018-03-09 DIAGNOSIS — E119 Type 2 diabetes mellitus without complications: Secondary | ICD-10-CM | POA: Diagnosis not present

## 2018-04-01 ENCOUNTER — Ambulatory Visit: Payer: Federal, State, Local not specified - PPO | Admitting: Family Medicine

## 2018-04-01 ENCOUNTER — Encounter: Payer: Self-pay | Admitting: Family Medicine

## 2018-04-01 VITALS — BP 125/80 | HR 65 | Temp 98.6°F | Resp 17 | Ht 79.0 in | Wt 273.5 lb

## 2018-04-01 DIAGNOSIS — F411 Generalized anxiety disorder: Secondary | ICD-10-CM

## 2018-04-01 DIAGNOSIS — E119 Type 2 diabetes mellitus without complications: Secondary | ICD-10-CM | POA: Diagnosis not present

## 2018-04-01 MED ORDER — LORAZEPAM 0.5 MG PO TABS: ORAL_TABLET | ORAL | 5 refills | Status: DC

## 2018-04-01 NOTE — Progress Notes (Signed)
OFFICE VISIT  04/01/2018   CC:  Chief Complaint  Patient presents with  . Follow-up    DM, Fasting      HPI:    Patient is a 58 y.o. Caucasian male who presents for 1 mo f/u DM 2. No change in meds last visit.   DM: glucoses--> 170s fasting and pt dieting after that so he has brought this down to about 140 fasting. Started riding exercise bike daily this week. He has big plans to continue better diet and wants to lose 40 lbs over the next 4-5 months. He is happy, feels good, is motivated!  Also, has chronic anxiety for which he takes lorazepam regularly.  Taking  [place] in chart dated 05/07/17, reviewed today.  UDS was done 05/08/17.   Past Medical History:  Diagnosis Date  . Cataract   . Chronic venous insufficiency   . Diverticulitis   . 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 Zigmund Daniel in [place].  . Glaucoma   . High triglycerides   . Hypertension approx 2010  . Hypertensive retinopathy of both eyes    Dr. Zigmund Daniel  . Mood disorder ([place])    dep/anx  . Obesity, Class I, BMI 30-34.9     Past Surgical History:  Procedure Laterality Date  . ANTERIOR CRUCIATE LIGAMENT REPAIR  1985  . COLONOSCOPY  2011   diverticulosis but no polyps: recall 75yrs  . [place] SURGERY  1999  . TONSILECTOMY, ADENOIDECTOMY, BILATERAL MYRINGOTOMY AND TUBES  1966    Outpatient Medications Prior to Visit  Medication Sig Dispense Refill  . amLODipine (NORVASC) 10 MG tablet TAKE 1 TABLET BY MOUTH DAILY 90 tablet 0  . aspirin EC 81 MG tablet Take 81 mg by mouth daily.    Marland Kitchen escitalopram (LEXAPRO) 10 MG tablet TAKE 1 TABLET BY MOUTH DAILY 30 tablet 5  . fenofibrate (TRICOR) 145 MG tablet TAKE 1 TABLET BY MOUTH DAILY 90 tablet 0  . GLUCOSAMINE-CHONDROITIN PO Take 1 capsule by mouth daily.    Marland Kitchen glucose blood test strip Use as instructed 100 each 12  . Lancets 30G MISC Use as directed to check blood sugar 100 each 11  . metFORMIN  (GLUCOPHAGE-XR) 500 MG 24 hr tablet TAKE 2 TABLETS BY MOUTH TWICE DAILY 360 tablet 0  . metoprolol succinate (TOPROL-XL) 25 MG 24 hr tablet TAKE 1 TABLET BY MOUTH DAILY 90 tablet 0  . Omega-3 Fatty Acids (FISH OIL PO) Take 1 capsule by mouth daily.    Marland Kitchen telmisartan (MICARDIS) 80 MG tablet TAKE 1 TABLET BY MOUTH DAILY 90 tablet 0  . LORazepam (ATIVAN) 0.5 MG tablet TAKE 1-2 TABLETS BY MOUTH TWICE DAILY AS NEEDED FOR ANXIETY 60 tablet 5   No facility-administered medications prior to visit.     No Known Allergies  ROS As per HPI  PE: Blood pressure 125/80, pulse 65, temperature 98.6 F (37 C), temperature source Oral, resp. rate 17, height 6\' 7"  (2.007 m), weight 273 lb 8 oz (124.1 kg), SpO2 99 %. Gen: Alert, well appearing.  Patient is oriented to person, place, time, and situation. AFFECT: pleasant, lucid thought and speech. No further exam today.  LABS:  Lab Results  Component Value Date   HGBA1C 8.3 (A) 02/24/2018     Chemistry      Component Value Date/Time   NA 139 12/03/2017 0832   K 4.3 12/03/2017 0832   CL 103 12/03/2017 0832   CO2  28 12/03/2017 0832   BUN 15 12/03/2017 0832   CREATININE 0.73 12/03/2017 0832      Component Value Date/Time   CALCIUM 9.6 12/03/2017 0832   ALKPHOS 68 05/08/2017 0917   AST 22 05/08/2017 0917   ALT 34 05/08/2017 0917   BILITOT 0.6 05/08/2017 0917      IMPRESSION AND PLAN:  DM 2, control not optimal but improving over the last month with pt's efforts at TLC. He wants to hold off on any additional meds at this time, give TLC more time. If A1c not improved in 2 mo then will discuss additional DM meds. Pt checking glucoses regularly now.  2) GAD/insomnia: doing well on 2 lorazepam tabs a night and his daily lexapro. RF'd lorazepam today (see HPI for details).  An After Visit Summary was printed and given to the patient.  FOLLOW UP: Return in about 2 months (around 05/31/2018) for annual CPE (fasting).  Signed:  Crissie Sickles, MD            04/01/2018

## 2018-04-14 ENCOUNTER — Other Ambulatory Visit: Payer: Self-pay | Admitting: Family Medicine

## 2018-04-25 ENCOUNTER — Other Ambulatory Visit: Payer: Self-pay | Admitting: Family Medicine

## 2018-05-07 ENCOUNTER — Encounter (INDEPENDENT_AMBULATORY_CARE_PROVIDER_SITE_OTHER): Payer: Federal, State, Local not specified - PPO | Admitting: Ophthalmology

## 2018-06-10 ENCOUNTER — Encounter: Payer: Federal, State, Local not specified - PPO | Admitting: Family Medicine

## 2018-07-02 ENCOUNTER — Encounter (INDEPENDENT_AMBULATORY_CARE_PROVIDER_SITE_OTHER): Payer: Federal, State, Local not specified - PPO | Admitting: Ophthalmology

## 2018-07-31 IMAGING — DX DG CHEST 2V
2 series · 2 of 2 positions shown · non-contrast
Comparison: None.

CLINICAL DATA: Chest pain, intermittent fever

EXAM:
CHEST  2 VIEW

[chest pa]
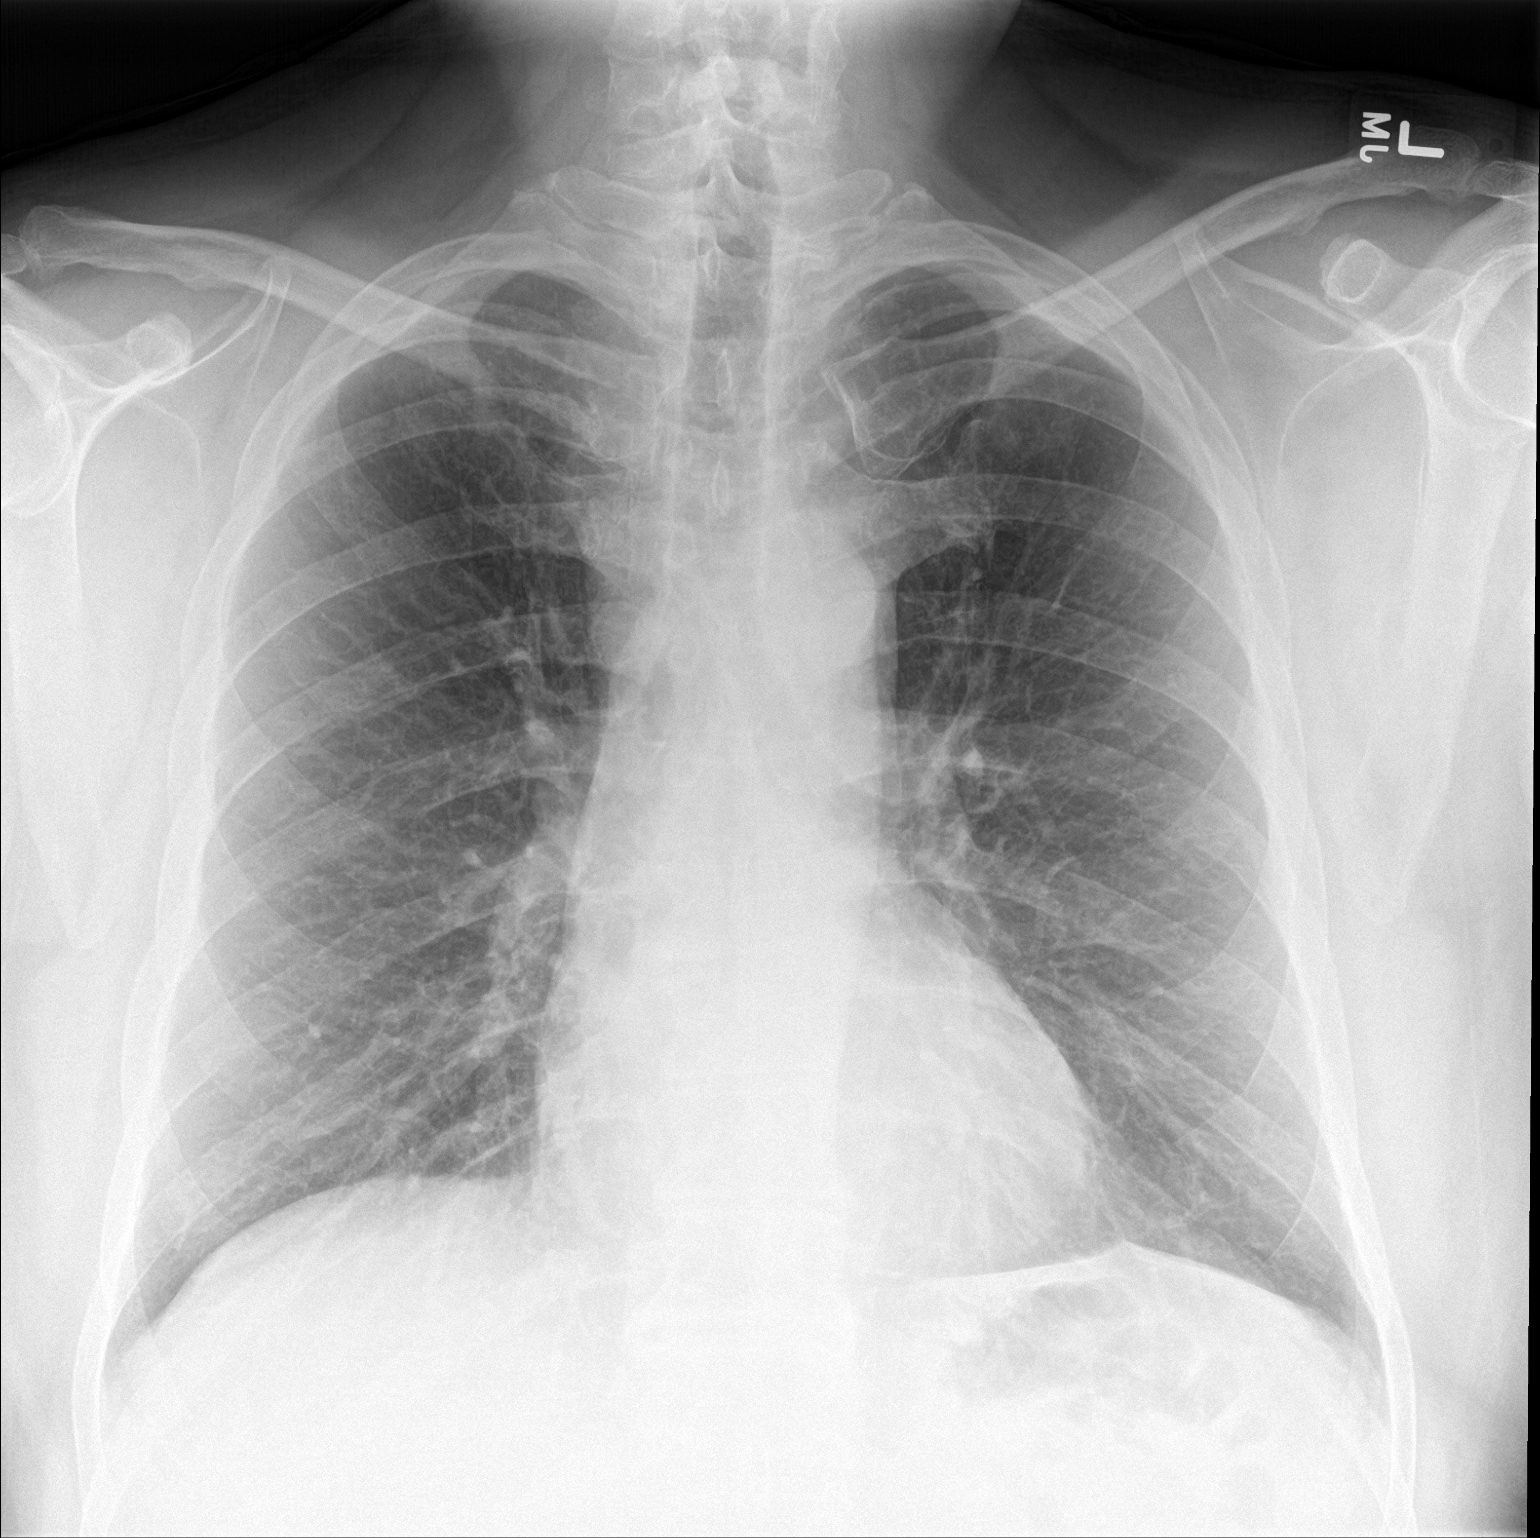

[chest lat]
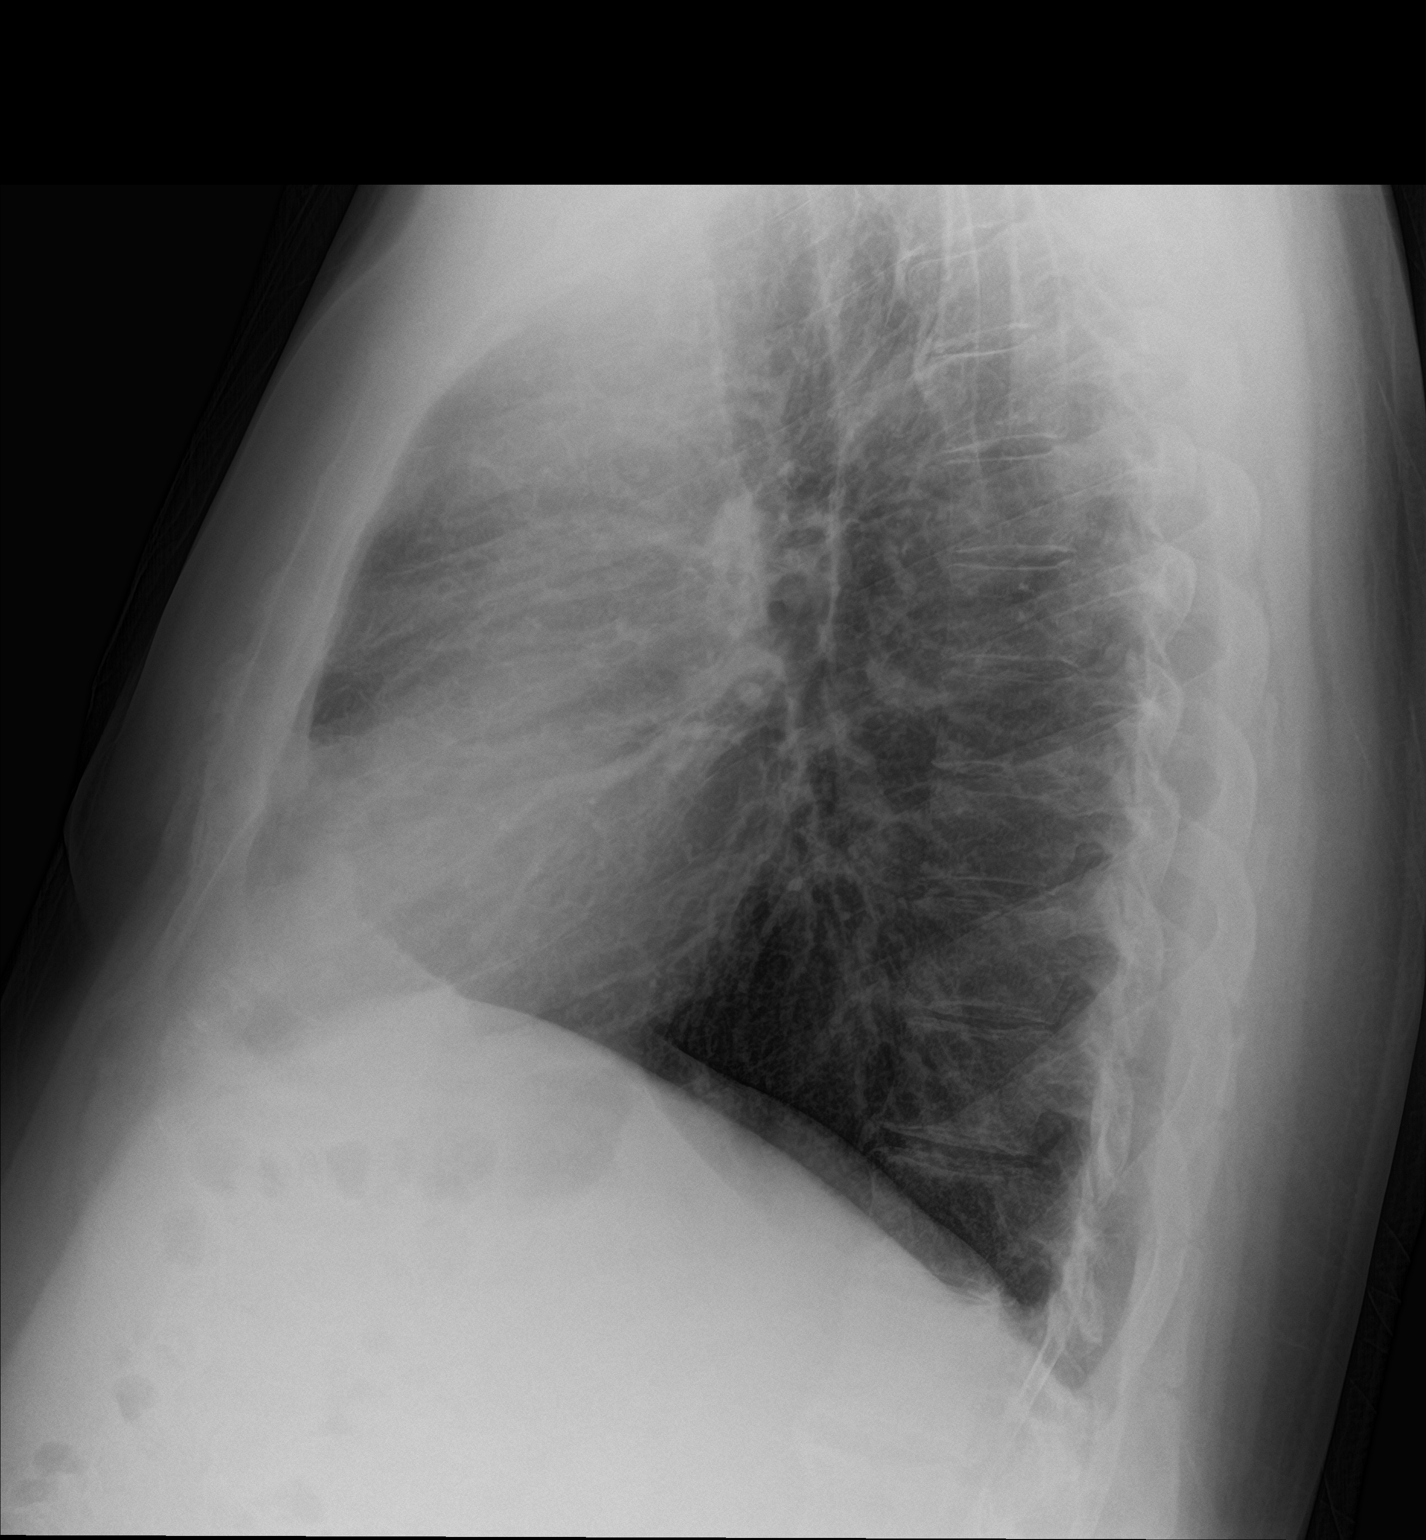

[2 of 2 positions shown; findings below may reference images not displayed]

FINDINGS: Cardiomediastinal silhouette is unremarkable. No infiltrate or
pleural effusion. No pulmonary edema. Mild degenerative changes
upper thoracic spine.
IMPRESSION: No active cardiopulmonary disease.

## 2018-08-14 ENCOUNTER — Ambulatory Visit: Payer: Federal, State, Local not specified - PPO | Admitting: Family Medicine

## 2018-08-14 ENCOUNTER — Encounter: Payer: Self-pay | Admitting: Family Medicine

## 2018-08-14 ENCOUNTER — Other Ambulatory Visit: Payer: Self-pay

## 2018-08-14 VITALS — BP 144/86 | HR 69 | Temp 98.3°F | Resp 16 | Ht 79.0 in | Wt 283.2 lb

## 2018-08-14 DIAGNOSIS — F411 Generalized anxiety disorder: Secondary | ICD-10-CM

## 2018-08-14 DIAGNOSIS — R202 Paresthesia of skin: Secondary | ICD-10-CM | POA: Diagnosis not present

## 2018-08-14 DIAGNOSIS — M79601 Pain in right arm: Secondary | ICD-10-CM

## 2018-08-14 DIAGNOSIS — M25511 Pain in right shoulder: Secondary | ICD-10-CM | POA: Diagnosis not present

## 2018-08-14 NOTE — Progress Notes (Signed)
OFFICE VISIT  08/14/2018   CC:  Chief Complaint  Patient presents with  . Arm Pain    radiating down whole right arm into hand   HPI:    Patient is a 58 y.o. Caucasian male who presents for right arm pain. Onset of R shoulder pain about 2 mo ago, with certain reaching movements it hurts all over shoulder intensely for 5-10 seconds.  Gradually has persisted more, become more constant.  Started to involve R upper arm about 2 wks ago.  Then, the last couple days has felt pain down bottom of R forearm his ring finger and pinky feel tingling and numbness.  He does rest his elbows on desk to type. No neck pain.  R arms sx's don't get triggered by neck movements. He is taking 600 mg ibup q4h for the last week or so.  Of note, I last saw him for routine DM/HTN/HLD f/u 04/01/18. Has f/u appt for this 09/04/18.  Anxiety: still taking lorazepam and doing fine.  Reviewed PMP AWARE today and most recent fill of lorazepam was 07/29/18, #60, Rx'd by me.  No suspicious activity. CSC 05/07/17-->will update today.  Past Medical History:  Diagnosis Date  . Cataract   . Chronic venous insufficiency   . Diverticulitis   . 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 Zigmund Daniel in [place].  . Glaucoma   . High triglycerides   . Hypertension approx 2010  . Hypertensive retinopathy of both eyes    Dr. Zigmund Daniel  . Mood disorder ([place])    dep/anx  . Obesity, Class I, BMI 30-34.9     Past Surgical History:  Procedure Laterality Date  . ANTERIOR CRUCIATE LIGAMENT REPAIR  1985  . COLONOSCOPY  2011   diverticulosis but no polyps: recall 57yrs  . [place] SURGERY  1999  . TONSILECTOMY, ADENOIDECTOMY, BILATERAL MYRINGOTOMY AND TUBES  1966    Outpatient Medications Prior to Visit  Medication Sig Dispense Refill  . amLODipine (NORVASC) 10 MG tablet TAKE 1 TABLET BY MOUTH EVERY DAY 90 tablet 1  . aspirin EC 81 MG tablet Take 81 mg by mouth  daily.    Marland Kitchen escitalopram (LEXAPRO) 10 MG tablet TAKE 1 TABLET BY MOUTH DAILY 30 tablet 5  . fenofibrate (TRICOR) 145 MG tablet TAKE 1 TABLET BY MOUTH EVERY DAY 90 tablet 1  . GLUCOSAMINE-CHONDROITIN PO Take 1 capsule by mouth daily.    Marland Kitchen glucose blood test strip Use as instructed 100 each 12  . Lancets 30G MISC Use as directed to check blood sugar 100 each 11  . LORazepam (ATIVAN) 0.5 MG tablet TAKE 1-2 TABLETS BY MOUTH TWICE DAILY AS NEEDED FOR ANXIETY 60 tablet 5  . metFORMIN (GLUCOPHAGE-XR) 500 MG 24 hr tablet TAKE 2 TABLETS BY MOUTH TWICE DAILY 360 tablet 1  . metoprolol succinate (TOPROL-XL) 25 MG 24 hr tablet TAKE 1 TABLET BY MOUTH EVERY DAY 90 tablet 1  . Omega-3 Fatty Acids (FISH OIL PO) Take 1 capsule by mouth daily.    Marland Kitchen telmisartan (MICARDIS) 80 MG tablet TAKE 1 TABLET BY MOUTH EVERY DAY 90 tablet 1   No facility-administered medications prior to visit.     No Known Allergies  ROS As per HPI  PE: Blood pressure (!) 144/86, pulse 69, temperature 98.3 F (36.8 C), temperature source Temporal, resp. rate 16, height 6\' 7"  (2.007 m), weight 283 lb 3.2 oz (128.5 kg), SpO2 97 %. Gen: Alert, well  appearing.  Patient is oriented to person, place, time, and situation. AFFECT: pleasant, lucid thought and speech. Neck: dec ROM in all motions but no pain. R shoulder pain elicited with arm in extreme of ER and ABduction but this is not consistently reproduced. No tenderness of shoulder region, upper arm, or forearm.  Sensation intact in R arm, hand, fingers. Strength 5/5 prox/dist bilat UE's. DTRs: triceps trace bilat.  Unable to elicit biceps or brachio reflex on either arm. Tinel's testing at ulnar tunnel NEG.  LABS:    Chemistry      Component Value Date/Time   NA 139 12/03/2017 0832   K 4.3 12/03/2017 0832   CL 103 12/03/2017 0832   CO2 28 12/03/2017 0832   BUN 15 12/03/2017 0832   CREATININE 0.73 12/03/2017 0832      Component Value Date/Time   CALCIUM 9.6 12/03/2017  0832   ALKPHOS 68 05/08/2017 0917   AST 22 05/08/2017 0917   ALT 34 05/08/2017 0917   BILITOT 0.6 05/08/2017 0917     Lab Results  Component Value Date   HGBA1C 8.3 (A) 02/24/2018   Lab Results  Component Value Date   CHOL 115 05/08/2017   HDL 35.60 (L) 05/08/2017   LDLCALC 51 05/08/2017   TRIG 143.0 05/08/2017   CHOLHDL 3 05/08/2017   Lab Results  Component Value Date   PSA 0.59 05/08/2017   PSA 0.76 05/06/2016   PSA 0.91 09/15/2014     IMPRESSION AND PLAN:  1) Right shoulder pain, gradually moving down arm over the last couple months.  Now with some paresthesias involving 4th and fifth fingers of R hand.   I can't reproduce his symptoms or elicit exam finding that is clearly supportive of etiology or clearly supportive of C spine radiculopathy.  Most likely he has RC tendonitis as well as a separate issue of ulnar compression neuropathy, but I really can't get a good feel for things in him.  In this case, I will refer him to sports medicine for an expert evaluation. I asked him to cut back on his ibuprofen to 600 mg tid max.  2) GAD: stable on lorazepam. Updated CSC today. No new rx for this today.  3) DM/HTN/HLD-->chronic illness f/u to be done at the end of this month with me.  An After Visit Summary was printed and given to the patient.  FOLLOW UP: Return for keep f/u appt set for July.  Signed:  Crissie Sickles, MD           08/14/2018

## 2018-09-04 ENCOUNTER — Encounter: Payer: Federal, State, Local not specified - PPO | Admitting: Family Medicine

## 2018-09-09 ENCOUNTER — Ambulatory Visit: Payer: Self-pay

## 2018-09-09 ENCOUNTER — Other Ambulatory Visit: Payer: Self-pay

## 2018-09-09 ENCOUNTER — Ambulatory Visit (INDEPENDENT_AMBULATORY_CARE_PROVIDER_SITE_OTHER): Payer: Federal, State, Local not specified - PPO | Admitting: Family Medicine

## 2018-09-09 ENCOUNTER — Encounter: Payer: Self-pay | Admitting: Family Medicine

## 2018-09-09 VITALS — BP 120/72 | HR 68 | Ht 79.0 in | Wt 288.0 lb

## 2018-09-09 DIAGNOSIS — M7581 Other shoulder lesions, right shoulder: Secondary | ICD-10-CM | POA: Diagnosis not present

## 2018-09-09 DIAGNOSIS — M19019 Primary osteoarthritis, unspecified shoulder: Secondary | ICD-10-CM | POA: Insufficient documentation

## 2018-09-09 DIAGNOSIS — M19011 Primary osteoarthritis, right shoulder: Secondary | ICD-10-CM | POA: Diagnosis not present

## 2018-09-09 DIAGNOSIS — M778 Other enthesopathies, not elsewhere classified: Secondary | ICD-10-CM | POA: Insufficient documentation

## 2018-09-09 DIAGNOSIS — M25511 Pain in right shoulder: Secondary | ICD-10-CM

## 2018-09-09 DIAGNOSIS — G8929 Other chronic pain: Secondary | ICD-10-CM

## 2018-09-09 MED ORDER — PENNSAID 2 % TD SOLN: 2.0000 g | Freq: Two times a day (BID) | TRANSDERMAL | 3 refills | Status: DC

## 2018-09-09 NOTE — Assessment & Plan Note (Signed)
Patient given injection today.  This will be diagnostic as well as therapeutic.  I do not think that this is the main component of his pain but I did feel that it likely is what is keeping him up at night.  We discussed topical anti-inflammatories and icing regimen.  Follow-up again in 4 to 8 weeks

## 2018-09-09 NOTE — Assessment & Plan Note (Signed)
Appears to have more of a tendinitis.  Discussed with patient in great length, and will try topical anti-inflammatories, icing regimen, home exercises as well as formal physical therapy.  Patient also given an injection in the acromioclavicular joint and hopefully will be beneficial.  This will help [place] diagnostically as well.  Patient will follow-up with me again in 4 to 8 weeks

## 2018-09-09 NOTE — Progress Notes (Signed)
[name] [address], [place] [address] Phone: [phone] Subjective:   I Brett Cruz am serving as a Education administrator for Dr. Hulan Saas.  I'm seeing this patient by the request  of:  McGowen, Adrian Blackwater, MD   CC: Shoulder pain  BJS:[id number]  Brett Cruz is a 58 y.o. male coming in with complaint of right shoulder pain. Some numbness and tingling for a few days. Hasn't hand any recently. Pain is not constant.  Onset- Chronic  Location - AC joint  Character- intense dull pain Aggravating factors- reaching Reliving factors-  Therapies tried- advil Severity-5 out of 10     Past Medical History:  Diagnosis Date  . Cataract   . Chronic venous insufficiency   . Diverticulitis   . 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 Zigmund Daniel in [place].  . Glaucoma   . High triglycerides   . Hypertension approx 2010  . Hypertensive retinopathy of both eyes    Dr. Zigmund Daniel  . Mood disorder ([place])    dep/anx  . Obesity, Class I, BMI 30-34.9    Past Surgical History:  Procedure Laterality Date  . ANTERIOR CRUCIATE LIGAMENT REPAIR  1985  . COLONOSCOPY  2011   diverticulosis but no polyps: recall 64yrs  . [place] SURGERY  1999  . TONSILECTOMY, ADENOIDECTOMY, BILATERAL MYRINGOTOMY AND TUBES  1966   Social History   Socioeconomic History  . Marital status: Married    Spouse name: Not on file  . Number of children: Not on file  . Years of education: Not on file  . Highest education level: Not on file  Occupational History  . Not on file  Social Needs  . Financial resource strain: Not on file  . Food insecurity    Worry: Not on file    Inability: Not on file  . Transportation needs    Medical: Not on file    Non-medical: Not on file  Tobacco Use  . Smoking status: Never Smoker  . Smokeless tobacco: Never Used  Substance and Sexual Activity  . Alcohol use: Yes   Comment: socially  . Drug use: No  . Sexual activity: Not on file  Lifestyle  . Physical activity    Days per week: Not on file    Minutes per session: Not on file  . Stress: Not on file  Relationships  . Social Herbalist on phone: Not on file    Gets together: Not on file    Attends religious service: Not on file    Active member of club or organization: Not on file    Attends meetings of clubs or organizations: Not on file    Relationship status: Not on file  Other Topics Concern  . Not on file  Social History Narrative   Married, 2 college age sons.   Orig from [place].   Relocated to [facility] 2013.   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   No tobacco or drugs.  Alcohol: occasional.   No Known Allergies Family History  Problem Relation Age of Onset  . Cancer Mother   . Cancer Father   . Heart disease Father   . Heart disease Brother     Current Outpatient Medications (Endocrine & Metabolic):  .  metFORMIN (GLUCOPHAGE-XR) 500 MG 24 hr tablet, TAKE 2 TABLETS BY MOUTH TWICE DAILY  Current Outpatient  Medications (Cardiovascular):  .  amLODipine (NORVASC) 10 MG tablet, TAKE 1 TABLET BY MOUTH EVERY DAY .  fenofibrate (TRICOR) 145 MG tablet, TAKE 1 TABLET BY MOUTH EVERY DAY .  metoprolol succinate (TOPROL-XL) 25 MG 24 hr tablet, TAKE 1 TABLET BY MOUTH EVERY DAY .  telmisartan (MICARDIS) 80 MG tablet, TAKE 1 TABLET BY MOUTH EVERY DAY   Current Outpatient Medications (Analgesics):  .  aspirin EC 81 MG tablet, Take 81 mg by mouth daily.   Current Outpatient Medications (Other):  .  escitalopram (LEXAPRO) 10 MG tablet, TAKE 1 TABLET BY MOUTH DAILY .  GLUCOSAMINE-CHONDROITIN PO, Take 1 capsule by mouth daily. Marland Kitchen  glucose blood test strip, Use as instructed .  Lancets 30G MISC, Use as directed to check blood sugar .  LORazepam (ATIVAN) 0.5 MG tablet, TAKE 1-2 TABLETS BY MOUTH TWICE DAILY AS NEEDED FOR ANXIETY .  Omega-3 Fatty  Acids (FISH OIL PO), Take 1 capsule by mouth daily. .  Diclofenac Sodium (PENNSAID) 2 % SOLN, Place 2 g onto the skin 2 (two) times daily.    Past medical history, social, surgical and family history all reviewed in electronic medical record.  No pertanent information unless stated regarding to the chief complaint.   Review of Systems:  No headache, visual changes, nausea, vomiting, diarrhea, constipation, dizziness, abdominal pain, skin rash, fevers, chills, night sweats, weight loss, swollen lymph nodes, body aches, joint swelling, muscle aches, chest pain, shortness of breath, mood changes.  Positive muscle aches  Objective  Blood pressure 120/72, pulse 68, height 6\' 7"  (2.007 m), weight 288 lb (130.6 kg), SpO2 97 %.    General: No apparent distress alert and oriented x3 mood and affect normal, dressed appropriately.  HEENT: Pupils equal, extraocular movements intact  Respiratory: Patient's speak in full sentences and does not appear short of breath  Cardiovascular: No lower extremity edema, non tender, no erythema   Skin: Warm dry intact with no signs of infection or rash on extremities or on axial skeleton.  Abdomen: Soft nontender  Neuro: Cranial nerves II through XII are intact, neurovascularly intact in all extremities with 2+ DTRs and 2+ pulses.  Lymph: No lymphadenopathy of posterior or anterior cervical chain or axillae bilaterally.  Gait normal with good balance and coordination.  MSK:  Non tender with full range of motion and good stability and symmetric strength and tone of elbows, wrist, hip, knee and ankles bilaterally.  Right shoulder exam shows the patient does have mild positive impingement.  Rotator cuff strength 5 out of 5.  Patient does have pain over the acromioclavicular joint and a positive crossover.  Lacks last 5 degrees of external rotation otherwise full range of motion.  Contralateral shoulder unremarkable  Limited musculoskeletal ultrasound was performed and  interpreted by Lyndal Pulley  Limited ultrasound shows the patient does have arthritic changes of the acromioclavicular joint with a hypoechoic changes that seems to be more of a swelling of the joint.  Patient has hypoechoic changes within the rotator cuff mostly of the supraspinatus but no true tear appreciated likely secondary to tendinitis.  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  Verbal informed consent obtained.  Time-out conducted.  Noted no overlying erythema, induration, or other signs  of local infection.  Skin prepped in a sterile fashion.  Local anesthesia: Topical Ethyl chloride.  With sterile technique and under real time ultrasound guidance: With a 25-gauge half inch needle injected with 0.5 cc of 0.5% Marcaine and 0.5 cc of Kenalog 40 mg/mL Completed without difficulty  Pain immediately resolved suggesting accurate placement of the medication.  Advised to call if fevers/chills, erythema, induration, drainage, or persistent bleeding.  Images permanently stored and available for review in the ultrasound unit.  Impression: Technically successful ultrasound guided injection.  [id number]; 15 additional minutes spent for Therapeutic exercises as stated in above notes.  This included exercises focusing on stretching, strengthening, with significant focus on eccentric aspects.   Long term goals include an improvement in range of motion, strength, endurance as well as avoiding reinjury. Patient's frequency would include in 1-2 times a day, 3-5 times a week for a duration of 6-12 weeks. Shoulder Exercises that included:   Basic scapular stabilization to include adduction and depression of scapula Sc[place]ion, focusing on proper movement and good control Internal and External rotation utilizing a theraband, with elbow tucked at side entire time Rows with theraband   Proper technique shown and discussed handout in great detail with ATC.  All questions were discussed and answered.     Impression and Recommendations:     This case required medical decision making of moderate complexity. The above documentation has been reviewed and is accurate and complete Lyndal Pulley, DO       Note: This dictation was prepared with Dragon dictation along with smaller phrase technology. Any transcriptional errors that result from this process are unintentional.

## 2018-09-09 NOTE — Patient Instructions (Signed)
Good to see you Injection today PT will call you See me again in 4-8 weeks

## 2018-09-16 ENCOUNTER — Other Ambulatory Visit: Payer: Self-pay

## 2018-09-16 ENCOUNTER — Ambulatory Visit (INDEPENDENT_AMBULATORY_CARE_PROVIDER_SITE_OTHER): Payer: Federal, State, Local not specified - PPO | Admitting: Family Medicine

## 2018-09-16 ENCOUNTER — Encounter: Payer: Self-pay | Admitting: Family Medicine

## 2018-09-16 VITALS — BP 134/78 | HR 64 | Temp 97.2°F | Resp 18 | Ht 79.0 in | Wt 284.0 lb

## 2018-09-16 DIAGNOSIS — Z79899 Other long term (current) drug therapy: Secondary | ICD-10-CM

## 2018-09-16 DIAGNOSIS — E781 Pure hyperglyceridemia: Secondary | ICD-10-CM

## 2018-09-16 DIAGNOSIS — F411 Generalized anxiety disorder: Secondary | ICD-10-CM

## 2018-09-16 DIAGNOSIS — Z125 Encounter for screening for malignant neoplasm of prostate: Secondary | ICD-10-CM

## 2018-09-16 DIAGNOSIS — I10 Essential (primary) hypertension: Secondary | ICD-10-CM | POA: Diagnosis not present

## 2018-09-16 DIAGNOSIS — E118 Type 2 diabetes mellitus with unspecified complications: Secondary | ICD-10-CM

## 2018-09-16 DIAGNOSIS — Z1283 Encounter for screening for malignant neoplasm of skin: Secondary | ICD-10-CM

## 2018-09-16 DIAGNOSIS — Z1211 Encounter for screening for malignant neoplasm of colon: Secondary | ICD-10-CM

## 2018-09-16 DIAGNOSIS — Z0000 Encounter for general adult medical examination without abnormal findings: Secondary | ICD-10-CM | POA: Diagnosis not present

## 2018-09-16 NOTE — Patient Instructions (Signed)

## 2018-09-16 NOTE — Progress Notes (Signed)
Office Note 09/16/2018  CC:  Chief Complaint  Patient presents with  . Annual Exam    Not fasting. Blood sugar running higher than normal x2 weeks. No changes in diet. Started Diclofenac from [facility].     HPI:  Brett Cruz is a 58 y.o. male who is being seen for annual health maintenance exam.  DM: approx the last 10-14 d or so has been seeing fasting glucoses 180-190s, one as high as 270. This is compared to his prior avg of 130-150. ?diet not as strict as usual. No formal exercise.  Has a sedentary job Librarian, academic).     Past Medical History:  Diagnosis Date  . Cataract   . Chronic venous insufficiency   . Diverticulitis   . 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 Zigmund Daniel in [place].  . Glaucoma   . High triglycerides   . Hypertension approx 2010  . Hypertensive retinopathy of both eyes    Dr. Zigmund Daniel  . Mood disorder ([place])    dep/anx  . Obesity, Class I, BMI 30-34.9     Past Surgical History:  Procedure Laterality Date  . ANTERIOR CRUCIATE LIGAMENT REPAIR  1985  . COLONOSCOPY  2011   diverticulosis but no polyps: recall 26yrs  . [place] SURGERY  1999  . TONSILECTOMY, ADENOIDECTOMY, BILATERAL MYRINGOTOMY AND TUBES  1966    Family History  Problem Relation Age of Onset  . Cancer Mother   . Cancer Father   . Heart disease Father   . Heart disease Brother     Social History   Socioeconomic History  . Marital status: Married    Spouse name: Not on file  . Number of children: Not on file  . Years of education: Not on file  . Highest education level: Not on file  Occupational History  . Not on file  Social Needs  . Financial resource strain: Not on file  . Food insecurity    Worry: Not on file    Inability: Not on file  . Transportation needs    Medical: Not on file    Non-medical: Not on file  Tobacco Use  . Smoking status: Never Smoker  . Smokeless tobacco: Never Used   Substance and Sexual Activity  . Alcohol use: Yes    Comment: socially  . Drug use: No  . Sexual activity: Not on file  Lifestyle  . Physical activity    Days per week: Not on file    Minutes per session: Not on file  . Stress: Not on file  Relationships  . Social Herbalist on phone: Not on file    Gets together: Not on file    Attends religious service: Not on file    Active member of club or organization: Not on file    Attends meetings of clubs or organizations: Not on file    Relationship status: Not on file  . Intimate partner violence    Fear of current or ex partner: Not on file    Emotionally abused: Not on file    Physically abused: Not on file    Forced sexual activity: Not on file  Other Topics Concern  . Not on file  Social History Narrative   Married, 2 college age sons.   Orig from [place].   Relocated to [facility] 2013.   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  No tobacco or drugs.  Alcohol: occasional.    Outpatient Medications Prior to Visit  Medication Sig Dispense Refill  . amLODipine (NORVASC) 10 MG tablet TAKE 1 TABLET BY MOUTH EVERY DAY 90 tablet 1  . aspirin EC 81 MG tablet Take 81 mg by mouth daily.    . Diclofenac Sodium (PENNSAID) 2 % SOLN Place 2 g onto the skin 2 (two) times daily. 112 g 3  . escitalopram (LEXAPRO) 10 MG tablet TAKE 1 TABLET BY MOUTH DAILY 30 tablet 5  . fenofibrate (TRICOR) 145 MG tablet TAKE 1 TABLET BY MOUTH EVERY DAY 90 tablet 1  . GLUCOSAMINE-CHONDROITIN PO Take 1 capsule by mouth daily.    Marland Kitchen glucose blood test strip Use as instructed 100 each 12  . Lancets 30G MISC Use as directed to check blood sugar 100 each 11  . LORazepam (ATIVAN) 0.5 MG tablet TAKE 1-2 TABLETS BY MOUTH TWICE DAILY AS NEEDED FOR ANXIETY 60 tablet 5  . metFORMIN (GLUCOPHAGE-XR) 500 MG 24 hr tablet TAKE 2 TABLETS BY MOUTH TWICE DAILY 360 tablet 1  . metoprolol succinate (TOPROL-XL) 25 MG 24 hr tablet TAKE  1 TABLET BY MOUTH EVERY DAY 90 tablet 1  . Omega-3 Fatty Acids (FISH OIL PO) Take 1 capsule by mouth daily.    Marland Kitchen telmisartan (MICARDIS) 80 MG tablet TAKE 1 TABLET BY MOUTH EVERY DAY 90 tablet 1   No facility-administered medications prior to visit.     No Known Allergies  ROS Review of Systems  Constitutional: Negative for appetite change, chills, fatigue and fever.  HENT: Negative for congestion, dental problem, ear pain and sore throat.   Eyes: Negative for discharge, redness and visual disturbance.  Respiratory: Negative for cough, chest tightness, shortness of breath and wheezing.   Cardiovascular: Negative for chest pain, palpitations and leg swelling.  Gastrointestinal: Negative for abdominal pain, blood in stool, diarrhea, nausea and vomiting.  Genitourinary: Negative for difficulty urinating, dysuria, flank pain, frequency, hematuria and urgency.  Musculoskeletal: Negative for arthralgias, back pain, joint swelling, myalgias and neck stiffness.  Skin: Negative for pallor and rash.  Neurological: Negative for dizziness, speech difficulty, weakness and headaches.  Hematological: Negative for adenopathy. Does not bruise/bleed easily.  Psychiatric/Behavioral: Negative for confusion and sleep disturbance. The patient is not nervous/anxious.     PE; Blood pressure 134/78, pulse 64, temperature (!) 97.2 F (36.2 C), temperature source Temporal, resp. rate 18, height 6\' 7"  (2.007 m), weight 284 lb (128.8 kg), SpO2 97 %. Gen: Alert, well appearing.  Patient is oriented to person, place, time, and situation. AFFECT: pleasant, lucid thought and speech. ENT: Ears: EACs clear, normal epithelium.  TMs with good light reflex and landmarks bilaterally.  Eyes: no injection, icteris, swelling, or exudate.  EOMI, PERRLA. Nose: no drainage or turbinate edema/swelling.  No injection or focal lesion.  Mouth: lips without lesion/swelling.  Oral mucosa pink and moist.  Dentition intact and without  obvious caries or gingival swelling.  Oropharynx without erythema, exudate, or swelling.  Neck: supple/nontender.  No LAD, mass, or TM.  Carotid pulses 2+ bilaterally, without bruits. CV: RRR, no m/r/g.   LUNGS: CTA bilat, nonlabored resps, good aeration in all lung fields. ABD: soft, NT, ND, BS normal.  No hepatospenomegaly or mass.  No bruits. EXT: no clubbing, cyanosis, or edema.  Musculoskeletal: no joint swelling, erythema, warmth, or tenderness.  ROM of all joints intact. Skin - no sores or suspicious lesions or rashes or color changes Rectal: patient deferred this Foot exam -;  no swelling, tenderness or skin or vascular lesions. Color and temperature is normal. Sensation is intact on all surfaces except some impairment to monofilament testing on R great toe.  Peripheral pulses are palpable. Toenails are normal.   Pertinent labs:  Lab Results  Component Value Date   TSH 2.04 05/08/2017   Lab Results  Component Value Date   WBC 6.2 05/08/2017   HGB 15.0 05/08/2017   HCT 43.9 05/08/2017   MCV 90.7 05/08/2017   PLT 261.0 05/08/2017   Lab Results  Component Value Date   CREATININE 0.73 12/03/2017   BUN 15 12/03/2017   NA 139 12/03/2017   K 4.3 12/03/2017   CL 103 12/03/2017   CO2 28 12/03/2017   Lab Results  Component Value Date   ALT 34 05/08/2017   AST 22 05/08/2017   ALKPHOS 68 05/08/2017   BILITOT 0.6 05/08/2017   Lab Results  Component Value Date   CHOL 115 05/08/2017   Lab Results  Component Value Date   HDL 35.60 (L) 05/08/2017   Lab Results  Component Value Date   LDLCALC 51 05/08/2017   Lab Results  Component Value Date   TRIG 143.0 05/08/2017   Lab Results  Component Value Date   CHOLHDL 3 05/08/2017   Lab Results  Component Value Date   PSA 0.59 05/08/2017   PSA 0.76 05/06/2016   PSA 0.91 09/15/2014   Lab Results  Component Value Date   HGBA1C 8.3 (A) 02/24/2018    ASSESSMENT AND PLAN:   Health maintenance exam: Reviewed age and  gender appropriate health maintenance issues (prudent diet, regular exercise, health risks of tobacco and excessive alcohol, use of seatbelts, fire alarms in home, use of sunscreen).  Also reviewed age and gender appropriate health screening as well as vaccine recommendations. Vaccines: all UTD, including shingrix. Labs: CBC w/diff, CMET, FLP, A1c, PSA--future, when pt fasting. Prostate ca screening: DRE -->pt deferred this. PSA ordered. Colon ca screening: next screening colonoscopy due 2021. First colonoscopy was done in [place], [place].  I have ordered referral to [facility]. Pt requested referral to dermatologist for skin ca screening.  GAD: he is stable with prn use of lorazepam. CSC updated today. Will get UDS when he comes for fasting labs tomorrow.  An After Visit Summary was printed and given to the patient.  FOLLOW UP:  Return in about 3 months (around 12/17/2018) for routine chronic illness f/u.  Signed:  Crissie Sickles, MD           09/16/2018

## 2018-09-17 ENCOUNTER — Encounter: Payer: Self-pay | Admitting: Internal Medicine

## 2018-09-22 ENCOUNTER — Other Ambulatory Visit: Payer: Self-pay

## 2018-09-22 ENCOUNTER — Ambulatory Visit (INDEPENDENT_AMBULATORY_CARE_PROVIDER_SITE_OTHER): Payer: Federal, State, Local not specified - PPO

## 2018-09-22 DIAGNOSIS — F411 Generalized anxiety disorder: Secondary | ICD-10-CM | POA: Diagnosis not present

## 2018-09-22 DIAGNOSIS — Z79899 Other long term (current) drug therapy: Secondary | ICD-10-CM | POA: Diagnosis not present

## 2018-09-22 DIAGNOSIS — E118 Type 2 diabetes mellitus with unspecified complications: Secondary | ICD-10-CM | POA: Diagnosis not present

## 2018-09-22 DIAGNOSIS — Z125 Encounter for screening for malignant neoplasm of prostate: Secondary | ICD-10-CM

## 2018-09-22 DIAGNOSIS — E781 Pure hyperglyceridemia: Secondary | ICD-10-CM

## 2018-09-22 DIAGNOSIS — I10 Essential (primary) hypertension: Secondary | ICD-10-CM | POA: Diagnosis not present

## 2018-09-22 LAB — LIPID PANEL
Cholesterol: 124 mg/dL (ref 0–200)
HDL: 37 mg/dL — ABNORMAL LOW (ref >39.00)
LDL Cholesterol: 54 mg/dL (ref 0–99)
NonHDL: 86.73
Total CHOL/HDL Ratio: 3
Triglycerides: 166 mg/dL — ABNORMAL HIGH (ref 0.0–149.0)
VLDL: 33.2 mg/dL (ref 0.0–40.0)

## 2018-09-22 LAB — CBC WITH DIFFERENTIAL/PLATELET
Basophils Absolute: 0.2 10*3/uL — ABNORMAL HIGH (ref 0.0–0.1)
Basophils Relative: 3.5 % — ABNORMAL HIGH (ref 0.0–3.0)
Eosinophils Absolute: 0.1 10*3/uL (ref 0.0–0.7)
Eosinophils Relative: 1.8 % (ref 0.0–5.0)
HCT: 43.7 % (ref 39.0–52.0)
Hemoglobin: 14.9 g/dL (ref 13.0–17.0)
Lymphocytes Relative: 39.5 % (ref 12.0–46.0)
Lymphs Abs: 2.5 10*3/uL (ref 0.7–4.0)
MCHC: 34 g/dL (ref 30.0–36.0)
MCV: 91.4 fl (ref 78.0–100.0)
Monocytes Absolute: 0.5 10*3/uL (ref 0.1–1.0)
Monocytes Relative: 7.3 % (ref 3.0–12.0)
Neutro Abs: 3.1 10*3/uL (ref 1.4–7.7)
Neutrophils Relative %: 47.9 % (ref 43.0–77.0)
Platelets: 223 10*3/uL (ref 150.0–400.0)
RBC: 4.79 Mil/uL (ref 4.22–5.81)
RDW: 13.1 % (ref 11.5–15.5)
WBC: 6.4 10*3/uL (ref 4.0–10.5)

## 2018-09-22 LAB — COMPREHENSIVE METABOLIC PANEL
ALT: 21 U/L (ref 0–53)
AST: 15 U/L (ref 0–37)
Albumin: 4.4 g/dL (ref 3.5–5.2)
Alkaline Phosphatase: 70 U/L (ref 39–117)
BUN: 13 mg/dL (ref 6–23)
CO2: 27 mEq/L (ref 19–32)
Calcium: 9.3 mg/dL (ref 8.4–10.5)
Chloride: 102 mEq/L (ref 96–112)
Creatinine, Ser: 0.63 mg/dL (ref 0.40–1.50)
GFR: 130.09 mL/min (ref >60.00)
Glucose, Bld: 189 mg/dL — ABNORMAL HIGH (ref 70–99)
Potassium: 4.3 mEq/L (ref 3.5–5.1)
Sodium: 137 mEq/L (ref 135–145)
Total Bilirubin: 0.6 mg/dL (ref 0.2–1.2)
Total Protein: 7 g/dL (ref 6.0–8.3)

## 2018-09-22 LAB — HEMOGLOBIN A1C: Hgb A1c MFr Bld: 8 % — ABNORMAL HIGH (ref 4.6–6.5)

## 2018-09-22 LAB — PSA: PSA: 0.69 ng/mL (ref 0.10–4.00)

## 2018-09-23 LAB — PAIN MGMT, PROFILE 8 W/CONF, U
6 Acetylmorphine: NEGATIVE ng/mL
Alcohol Metabolites: NEGATIVE ng/mL (ref <500)
Amphetamines: NEGATIVE ng/mL
Benzodiazepines: NEGATIVE ng/mL
Buprenorphine, Urine: NEGATIVE ng/mL
Cocaine Metabolite: NEGATIVE ng/mL
Creatinine: 45.6 mg/dL
MDMA: NEGATIVE ng/mL
Marijuana Metabolite: NEGATIVE ng/mL
Opiates: NEGATIVE ng/mL
Oxidant: NEGATIVE ug/mL
Oxycodone: NEGATIVE ng/mL
pH: 6.9 (ref 4.5–9.0)

## 2018-09-25 ENCOUNTER — Other Ambulatory Visit: Payer: Self-pay

## 2018-09-25 MED ORDER — PIOGLITAZONE HCL 45 MG PO TABS: 45.0000 mg | ORAL_TABLET | Freq: Every day | ORAL | 5 refills | Status: DC

## 2018-09-25 NOTE — Progress Notes (Signed)
pio

## 2018-09-28 ENCOUNTER — Other Ambulatory Visit: Payer: Self-pay | Admitting: Family Medicine

## 2018-10-01 ENCOUNTER — Other Ambulatory Visit: Payer: Self-pay | Admitting: Family Medicine

## 2018-10-01 ENCOUNTER — Ambulatory Visit (INDEPENDENT_AMBULATORY_CARE_PROVIDER_SITE_OTHER): Payer: Federal, State, Local not specified - PPO | Admitting: Physical Therapy

## 2018-10-01 ENCOUNTER — Encounter: Payer: Self-pay | Admitting: Physical Therapy

## 2018-10-01 DIAGNOSIS — M25511 Pain in right shoulder: Secondary | ICD-10-CM | POA: Diagnosis not present

## 2018-10-01 DIAGNOSIS — M25611 Stiffness of right shoulder, not elsewhere classified: Secondary | ICD-10-CM | POA: Diagnosis not present

## 2018-10-01 DIAGNOSIS — M5442 Lumbago with sciatica, left side: Secondary | ICD-10-CM | POA: Diagnosis not present

## 2018-10-01 DIAGNOSIS — M6281 Muscle weakness (generalized): Secondary | ICD-10-CM | POA: Diagnosis not present

## 2018-10-01 DIAGNOSIS — G8929 Other chronic pain: Secondary | ICD-10-CM

## 2018-10-02 NOTE — Telephone Encounter (Signed)
RF request for Ativan 0.5mg  tab LOV: 09/16/18 Next ov: not scheduled  Last written: 04/01/18 #60 x 5 refills  Please advise

## 2018-10-03 ENCOUNTER — Encounter: Payer: Self-pay | Admitting: Physical Therapy

## 2018-10-03 NOTE — Therapy (Signed)
[facility] [address] [place], [place], [address] Phone: [phone]   Fax:  [phone]  Physical Therapy Evaluation  Patient Details  Name: Brett Cruz MRN: [id number] Date of Birth: 21-Sep-1960 Referring Provider (PT): Creig Hines   Encounter Date: 10/01/2018  PT End of Session - 10/03/18 1333    Visit Number  1    Number of Visits  12    Date for PT Re-Evaluation  11/12/18    Authorization Type  BCBS    PT Start Time  [phone]    PT Stop Time  0930    PT Time Calculation (min)  35 min    Activity Tolerance  Patient tolerated treatment well    Behavior During Therapy  [facility] for tasks assessed/performed       Past Medical History:  Diagnosis Date  . Arthritis of right acromioclavicular joint    steroid inj 09/2018 (Dr. Tamala Julian)  . Cataract   . Chronic venous insufficiency   . Diverticulitis   . 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 Zigmund Daniel in [place].  . Glaucoma   . High triglycerides   . Hypertension approx 2010  . Hypertensive retinopathy of both eyes    Dr. Zigmund Daniel  . Mood disorder ([place])    dep/anx  . Obesity, Class I, BMI 30-34.9   . Right rotator cuff tendinitis     Past Surgical History:  Procedure Laterality Date  . ANTERIOR CRUCIATE LIGAMENT REPAIR  1985  . COLONOSCOPY  2011   diverticulosis but no polyps: recall 14yrs  . [place] SURGERY  1999  . TONSILECTOMY, ADENOIDECTOMY, BILATERAL MYRINGOTOMY AND TUBES  1966    There were no vitals filed for this visit.   Subjective Assessment - 10/03/18 1344    Subjective  Pt states increased pain in R shoulder and back. Pain in R shoulder started about 2 months ago, no incident to report. He works as Optometrist, is R handed. Recent injection has helped ROM and strength quite a bit, but still having difficulty with full ROM, and elevation.  Also has chronic back pain, with pain into L LE and toes,  increased with standing, better with sitting. He would really like to start exercising, and would like to improve back pain so he can do this, as well as learn HEP.    Limitations  Lifting;Standing;House hold activities    Patient Stated Goals  decreased pain in R shoulder, back, and improved ability for exercise    Currently in Pain?  Yes    Pain Score  5     Pain Location  Shoulder    Pain Orientation  Right    Pain Descriptors / Indicators  Aching    Pain Type  Acute pain    Pain Radiating Towards  posterior shoulder    Pain Onset  More than a month ago    Pain Frequency  Intermittent    Aggravating Factors   reaching up, down, behind back    Pain Relieving Factors  injection    Multiple Pain Sites  Yes    Pain Score  5    Pain Location  Back    Pain Orientation  Right;Left    Pain Descriptors / Indicators  Aching;Tightness    Pain Type  Chronic pain    Pain Radiating Towards  L lower leg, foot    Pain Onset  More than a month ago  Pain Frequency  Intermittent    Aggravating Factors   standing too long    Pain Relieving Factors  sitting         [facility] PT Assessment - 10/03/18 0001      Assessment   Medical Diagnosis  R shoulder Pain, Back pain    Referring Provider (PT)  Tamala Julian, Z    Hand Dominance  Right    Prior Therapy  No      Balance Screen   Has the patient fallen in the past 6 months  No      Prior Function   Level of Independence  Independent      Cognition   Overall Cognitive Status  Within Functional Limits for tasks assessed      ROM / Strength   AROM / PROM / Strength  AROM;PROM;Strength      AROM   Overall AROM Comments  Lumbar: flex: significant limitation: 21 in from floor, Extension: mild deficit:  SB: mild deficit     AROM Assessment Site  Shoulder    Right/Left Shoulder  Right    Right Shoulder Flexion  115 Degrees    Right Shoulder ABduction  95 Degrees    Right Shoulder Internal Rotation  60 Degrees    Right Shoulder External Rotation  50  Degrees      PROM   PROM Assessment Site  Shoulder    Right/Left Shoulder  Right    Right Shoulder Flexion  140 Degrees    Right Shoulder ABduction  130 Degrees    Right Shoulder Internal Rotation  65 Degrees    Right Shoulder External Rotation  60 Degrees      Strength   Overall Strength Comments  shoulder 4-/5 at mid range    Strength Assessment Site  Shoulder    Right/Left Shoulder  Right    Right Shoulder Flexion  3-/5    Right Shoulder ABduction  3-/5    Right Shoulder Internal Rotation  4/5    Right Shoulder External Rotation  4/5      Special Tests   Other special tests  painful empty can and  cross arm.                Objective measurements completed on examination: See above findings.      [facility] Adult PT Treatment/Exercise - 10/03/18 0001      Exercises   Exercises  Shoulder;Lumbar             PT Education - 10/03/18 1333    Education Details  PT POC,  Exam findings    Person(s) Educated  Patient    Methods  Explanation    Comprehension  Verbalized understanding;Returned demonstration       PT Short Term Goals - 10/03/18 1335      PT SHORT TERM GOAL #1   Title  Pt to be independent with initial HEP for R shoulder and back    Time  2    Period  Weeks    Status  New    Target Date  10/15/18      PT SHORT TERM GOAL #2   Title  Pt to report decreased pain in R shoulder, to 3/10 with activity    Time  2    Period  Weeks    Status  New    Target Date  10/15/18        PT Long Term Goals - 10/03/18 1336      PT  LONG TERM GOAL #1   Title  Pt to be independent with final HEP for R shoulder and back    Time  6    Period  Weeks    Status  New    Target Date  11/12/18      PT LONG TERM GOAL #2   Title  Pt to report decreased pain in R shoulder to 0-2/10 with activity    Time  6    Period  Weeks    Status  New    Target Date  11/12/18      PT LONG TERM GOAL #3   Title  Pt to demo improved strength of R shoulder to at least 4+/5  to improve ability for reaching , lifting, and IADLS.      PT LONG TERM GOAL #4   Title  Pt to demo AROM of R shoulder to be WNL to improve ability for ADLs and IADLS.    Time  6    Period  Weeks    Status  New    Target Date  11/12/18      PT LONG TERM GOAL #5   Title  Pt to report decreased pain in low back and R LE to be 0-3/10 with standing activity.    Time  6    Period  Weeks    Status  New    Target Date  11/12/18             Plan - 10/03/18 1350    Clinical Impression Statement  Pt 20 min late to appt today, will continue assessment of lumbar and radicular pain next visit, and add goals as needed. Pt presents with primary complaint of increased pain in R shoulder and low back. He has decreased AROM, PROM, and strength of R shoulder, and has decreased ability or reaching, lifting, and IADLS. Pt with poor lumbar ROM, with increased pain with standing and walking, as well as radicular pain into R LE. Pt with decreased ability for full functional activiteis due to pain. Pt to benefit from skilled PT to improve deficits and progess to indpendent ability for exercise.    Examination-Activity Limitations  Locomotion Level;Reach Overhead;Bend;Carry;Squat;Stand;Lift    Examination-Participation Restrictions  Cleaning;Community Activity;Yard Work    [employer]chant [employer] officer  Evolving/Moderate complexity    Clinical Decision Making  Moderate    Rehab Potential  Good    PT Frequency  2x / week    PT Duration  6 weeks    PT Treatment/Interventions  ADLs/Self Care Home Management;Cryotherapy;Electrical Stimulation;Ultrasound;Traction;Moist Heat;Iontophoresis 4mg /ml Dexamethasone;Gait training;Stair training;Functional mobility training;Therapeutic activities;Therapeutic exercise;Balance training;Neuromuscular re-education;Manual techniques;Patient/family education;Passive range of motion;Dry needling;Spinal Manipulations;Joint Manipulations;Taping    Consulted and Agree with  Plan of Care  Patient       Patient will benefit from skilled therapeutic intervention in order to improve the following deficits and impairments:  Decreased range of motion, Impaired UE functional use, Increased muscle spasms, Decreased endurance, Decreased activity tolerance, Pain, Improper body mechanics, Impaired flexibility, Hypomobility, Decreased mobility, Decreased strength  Visit Diagnosis: Acute pain of right shoulder  Stiffness of right shoulder, not elsewhere classified  Muscle weakness (generalized)  Chronic bilateral low back pain with left-sided sciatica     Problem List Patient Active Problem List   Diagnosis Date Noted  . Right shoulder tendinitis 09/09/2018  . AC (acromioclavicular) arthritis 09/09/2018  . GAD (generalized anxiety disorder) 05/07/2017  . Wheeze 09/23/2014  . Maxillary sinusitis 09/23/2014  . Acute bronchitis 09/23/2014  .  Cough 09/23/2014  . Health maintenance examination 09/15/2014  . Local skin infection 12/17/2013  . Ankle edema 12/17/2013  . Type II or unspecified type diabetes mellitus without mention of complication, uncontrolled 06/16/2013  . Preventative health care 06/16/2013  . Hypertriglyceridemia 02/09/2013  . Chronic venous insufficiency 01/12/2013  . Prostate cancer screening 11/03/2012  . Colon cancer screening 11/03/2012  . HTN (hypertension) 09/28/2012  . Diabetes mellitus with complication ([place]) [id number]   Lyndee Hensen, PT, DPT 1:54 PM  10/03/18   Lyndee Hensen 10/03/2018, 1:54 PM  [facility] [address] [place], [place], [address] Phone: [phone]   Fax:  [phone]  Name: Brett Cruz MRN: [id number] Date of Birth: [birth year]

## 2018-10-06 ENCOUNTER — Ambulatory Visit: Payer: Federal, State, Local not specified - PPO | Admitting: Physical Therapy

## 2018-10-06 ENCOUNTER — Other Ambulatory Visit: Payer: Self-pay

## 2018-10-06 ENCOUNTER — Encounter: Payer: Self-pay | Admitting: Physical Therapy

## 2018-10-06 DIAGNOSIS — M25611 Stiffness of right shoulder, not elsewhere classified: Secondary | ICD-10-CM | POA: Diagnosis not present

## 2018-10-06 DIAGNOSIS — M25511 Pain in right shoulder: Secondary | ICD-10-CM | POA: Diagnosis not present

## 2018-10-06 DIAGNOSIS — M6281 Muscle weakness (generalized): Secondary | ICD-10-CM | POA: Diagnosis not present

## 2018-10-06 NOTE — Therapy (Signed)
[facility] [address] [place], [place], [address] Phone: [phone]   Fax:  [phone]  Physical Therapy Treatment  Patient Details  Name: Brett Cruz MRN: [id number] Date of Birth: 1960/09/17 Referring Provider (PT): Creig Hines   Encounter Date: 10/06/2018  PT End of Session - 10/06/18 0952    Visit Number  2    Number of Visits  12    Date for PT Re-Evaluation  11/12/18    Authorization Type  BCBS    PT Start Time  0805    PT Stop Time  0850    PT Time Calculation (min)  45 min    Activity Tolerance  Patient tolerated treatment well    Behavior During Therapy  [facility] for tasks assessed/performed       Past Medical History:  Diagnosis Date  . Arthritis of right acromioclavicular joint    steroid inj 09/2018 (Dr. Tamala Julian)  . Cataract   . Chronic venous insufficiency   . Diverticulitis   . 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 Zigmund Daniel in [place].  . Glaucoma   . High triglycerides   . Hypertension approx 2010  . Hypertensive retinopathy of both eyes    Dr. Zigmund Daniel  . Mood disorder ([place])    dep/anx  . Obesity, Class I, BMI 30-34.9   . Right rotator cuff tendinitis     Past Surgical History:  Procedure Laterality Date  . ANTERIOR CRUCIATE LIGAMENT REPAIR  1985  . COLONOSCOPY  2011   diverticulosis but no polyps: recall 109yrs  . [place] SURGERY  1999  . TONSILECTOMY, ADENOIDECTOMY, BILATERAL MYRINGOTOMY AND TUBES  1966    There were no vitals filed for this visit.  Subjective Assessment - 10/06/18 0814    Subjective  Pt with no new complaints. States shoulder is a bit sore.    Currently in Pain?  Yes    Pain Score  4     Pain Location  Shoulder    Pain Orientation  Right    Pain Descriptors / Indicators  Aching    Pain Type  Acute pain    Pain Onset  More than a month ago    Pain Frequency  Intermittent    Multiple Pain Sites  Yes    Pain  Score  3    Pain Location  Back    Pain Orientation  Right;Left    Pain Descriptors / Indicators  Aching;Tightness    Pain Type  Chronic pain    Pain Onset  More than a month ago    Pain Frequency  Intermittent         [facility] PT Assessment - 10/06/18 0001      Palpation   Palpation comment  Hypomobile lumbar spine with spring testing. TIghtness at bil lumbar musculature, minimal pain in glute,        Special Tests   Other special tests  Significant tightness of bil HS, Neg SLR, but does seem to have increased neural tension on L LE.                    [facility] Adult PT Treatment/Exercise - 10/06/18 0001      Exercises   Exercises  Shoulder;Lumbar      Lumbar Exercises: Stretches   Active Hamstring Stretch  3 reps;30 seconds    Active Hamstring Stretch Limitations  seated    Piriformis Stretch  2  reps;30 seconds    Piriformis Stretch Limitations  supine mod fig 4;       Lumbar Exercises: Standing   Other Standing Lumbar Exercises  Extension: x15      Lumbar Exercises: Prone   Other Prone Lumbar Exercises  Prone on elbows: increased LE symptoms.      Shoulder Exercises: Supine   External Rotation  AAROM;15 reps    External Rotation Limitations  cane    Flexion  15 reps;AAROM    Flexion Limitations  cane       Shoulder Exercises: Standing   Row  20 reps    Theraband Level (Shoulder Row)  Level 3 (Green)    Other Standing Exercises  wall slides x15;       Shoulder Exercises: Pulleys   Flexion  2 minutes      Shoulder Exercises: Stretch   Corner Stretch Limitations  add next visit       Manual Therapy   Manual Therapy  Joint mobilization;Passive ROM;Manual Traction    Joint Mobilization  GHJ mobs Inf and Post gr 3    Passive ROM  R shoulder, all motions    Manual Traction  L LE, x3 min for lumbar pump               PT Short Term Goals - 10/03/18 1335      PT SHORT TERM GOAL #1   Title  Pt to be independent with initial HEP for R shoulder and  back    Time  2    Period  Weeks    Status  New    Target Date  10/15/18      PT SHORT TERM GOAL #2   Title  Pt to report decreased pain in R shoulder, to 3/10 with activity    Time  2    Period  Weeks    Status  New    Target Date  10/15/18        PT Long Term Goals - 10/03/18 1336      PT LONG TERM GOAL #1   Title  Pt to be independent with final HEP for R shoulder and back    Time  6    Period  Weeks    Status  New    Target Date  11/12/18      PT LONG TERM GOAL #2   Title  Pt to report decreased pain in R shoulder to 0-2/10 with activity    Time  6    Period  Weeks    Status  New    Target Date  11/12/18      PT LONG TERM GOAL #3   Title  Pt to demo improved strength of R shoulder to at least 4+/5 to improve ability for reaching , lifting, and IADLS.      PT LONG TERM GOAL #4   Title  Pt to demo AROM of R shoulder to be WNL to improve ability for ADLs and IADLS.    Time  6    Period  Weeks    Status  New    Target Date  11/12/18      PT LONG TERM GOAL #5   Title  Pt to report decreased pain in low back and R LE to be 0-3/10 with standing activity.    Time  6    Period  Weeks    Status  New    Target Date  11/12/18  Plan - 10/06/18 0954    Clinical Impression Statement  Pt educated on HEP for shoulder, and back today. Mobilizations and ther ex done for shoulder, to improve ROM. Assessment finished for back today, pt with soreness at L low lumbar region, into L glute, with tingling into L toes. Pt with improved symptoms with standing extension, but increased pain with prone on elbows. Will continue to assess for directional preference. He has significant tightness and limitation at posterior chain, given stretches for this today. Plan to progress as tolerated.    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/Moderate complexity    Rehab Potential  Good    PT Frequency  2x / week    PT Duration  6 weeks    PT Treatment/Interventions  ADLs/Self Care Home Management;Cryotherapy;Electrical Stimulation;Ultrasound;Traction;Moist Heat;Iontophoresis 4mg /ml Dexamethasone;Gait training;Stair training;Functional mobility training;Therapeutic activities;Therapeutic exercise;Balance training;Neuromuscular re-education;Manual techniques;Patient/family education;Passive range of motion;Dry needling;Spinal Manipulations;Joint Manipulations;Taping    Consulted and Agree with Plan of Care  Patient       Patient will benefit from skilled therapeutic intervention in order to improve the following deficits and impairments:  Decreased range of motion, Impaired UE functional use, Increased muscle spasms, Decreased endurance, Decreased activity tolerance, Pain, Improper body mechanics, Impaired flexibility, Hypomobility, Decreased mobility, Decreased strength  Visit Diagnosis: Acute pain of right shoulder  Stiffness of right shoulder, not elsewhere classified  Muscle weakness (generalized)     Problem List Patient Active Problem List   Diagnosis Date Noted  . Right shoulder tendinitis 09/09/2018  . AC (acromioclavicular) arthritis 09/09/2018  . GAD (generalized anxiety disorder) 05/07/2017  . Wheeze 09/23/2014  . Maxillary sinusitis 09/23/2014  . Acute bronchitis 09/23/2014  . Cough 09/23/2014  . Health maintenance examination 09/15/2014  . Local skin infection 12/17/2013  . Ankle edema 12/17/2013  . Type II or unspecified type diabetes mellitus without mention of complication, uncontrolled 06/16/2013  . Preventative health care 06/16/2013  . Hypertriglyceridemia 02/09/2013  . Chronic venous insufficiency 01/12/2013  . Prostate cancer screening 11/03/2012  . Colon cancer screening 11/03/2012  . HTN (hypertension) 09/28/2012  . Diabetes mellitus with  complication ([place]) 99991111    Lyndee Hensen, PT, DPT 10:12 AM  10/06/18    [facility] [place], [place], [address] Phone: [phone]   Fax:  [phone]  Name: Kosuke Call MRN: [id number] Date of Birth: 05-01-1960

## 2018-10-08 ENCOUNTER — Encounter: Payer: Self-pay | Admitting: Physical Therapy

## 2018-10-08 ENCOUNTER — Ambulatory Visit: Payer: Federal, State, Local not specified - PPO | Admitting: Physical Therapy

## 2018-10-08 ENCOUNTER — Other Ambulatory Visit: Payer: Self-pay

## 2018-10-08 DIAGNOSIS — M25511 Pain in right shoulder: Secondary | ICD-10-CM

## 2018-10-08 DIAGNOSIS — M25611 Stiffness of right shoulder, not elsewhere classified: Secondary | ICD-10-CM | POA: Diagnosis not present

## 2018-10-08 DIAGNOSIS — M5442 Lumbago with sciatica, left side: Secondary | ICD-10-CM | POA: Diagnosis not present

## 2018-10-08 DIAGNOSIS — M6281 Muscle weakness (generalized): Secondary | ICD-10-CM | POA: Diagnosis not present

## 2018-10-08 DIAGNOSIS — G8929 Other chronic pain: Secondary | ICD-10-CM

## 2018-10-08 NOTE — Therapy (Signed)
[facility] [address] [place], [place], [address] Phone: [phone]   Fax:  [phone]  Physical Therapy Treatment  Patient Details  Name: Brett Cruz MRN: [id number] Date of Birth: [birth year] Referring Provider (PT): Creig Hines   Encounter Date: 10/08/2018  PT End of Session - 10/08/18 0916    Visit Number  3    Number of Visits  12    Date for PT Re-Evaluation  11/12/18    Authorization Type  BCBS    PT Start Time  0803    PT Stop Time  0845    PT Time Calculation (min)  42 min    Activity Tolerance  Patient tolerated treatment well    Behavior During Therapy  [facility] for tasks assessed/performed       Past Medical History:  Diagnosis Date  . Arthritis of right acromioclavicular joint    steroid inj 09/2018 (Dr. Tamala Julian)  . Cataract   . Chronic venous insufficiency   . Diverticulitis   . 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 Zigmund Daniel in [place].  . Glaucoma   . High triglycerides   . Hypertension approx 2010  . Hypertensive retinopathy of both eyes    Dr. Zigmund Daniel  . Mood disorder ([place])    dep/anx  . Obesity, Class I, BMI 30-34.9   . Right rotator cuff tendinitis     Past Surgical History:  Procedure Laterality Date  . ANTERIOR CRUCIATE LIGAMENT REPAIR  1985  . COLONOSCOPY  2011   diverticulosis but no polyps: recall 44yrs  . [place] SURGERY  1999  . TONSILECTOMY, ADENOIDECTOMY, BILATERAL MYRINGOTOMY AND TUBES  1966    There were no vitals filed for this visit.  Subjective Assessment - 10/08/18 0915    Subjective  Pt states minimal back pain today, thinks shoulder is getting better.    Currently in Pain?  Yes    Pain Score  4     Pain Location  Shoulder    Pain Orientation  Right    Pain Descriptors / Indicators  Aching    Pain Type  Acute pain    Pain Onset  More than a month ago    Pain Frequency  Intermittent    Multiple Pain Sites  Yes     Pain Score  3    Pain Location  Back    Pain Orientation  Right;Left    Pain Descriptors / Indicators  Aching;Tightness    Pain Type  Chronic pain    Pain Radiating Towards  L lower leg, foot    Pain Onset  More than a month ago    Pain Frequency  Intermittent                       [facility] Adult PT Treatment/Exercise - 10/08/18 0807      Exercises   Exercises  Shoulder;Lumbar      Lumbar Exercises: Stretches   Active Hamstring Stretch  3 reps;30 seconds    Active Hamstring Stretch Limitations  seated    Piriformis Stretch  2 reps;30 seconds    Piriformis Stretch Limitations  supine mod fig 4;       Lumbar Exercises: Standing   Other Standing Lumbar Exercises  Extension: x15      Lumbar Exercises: Supine   Ab Set  20 reps;5 seconds    Clam  20 reps    Clam Limitations  GTB with TA    Bent Knee Raise  20 reps    Bent Knee Raise Limitations  with TA      Lumbar Exercises: Prone   Other Prone Lumbar Exercises  --      Shoulder Exercises: Supine   External Rotation  AAROM;15 reps    External Rotation Limitations  cane    Flexion  15 reps;AAROM    Flexion Limitations  cane       Shoulder Exercises: Standing   Flexion  AROM;15 reps    Flexion Limitations  to 120 deg    Row  --    Theraband Level (Shoulder Row)  --    Other Standing Exercises  --      Shoulder Exercises: Pulleys   Flexion  --      Shoulder Exercises: Stretch   Corner Stretch  20 seconds;2 reps    Corner Stretch Limitations  45 and 90 deg       Manual Therapy   Manual Therapy  Joint mobilization;Passive ROM;Manual Traction    Joint Mobilization  GHJ mobs Inf and Post gr 3    Passive ROM  R shoulder, all motions    Manual Traction  L LE, x3 min for lumbar pump- inc numbness in L toes               PT Short Term Goals - 10/03/18 1335      PT SHORT TERM GOAL #1   Title  Pt to be independent with initial HEP for R shoulder and back    Time  2    Period  Weeks    Status   New    Target Date  10/15/18      PT SHORT TERM GOAL #2   Title  Pt to report decreased pain in R shoulder, to 3/10 with activity    Time  2    Period  Weeks    Status  New    Target Date  10/15/18        PT Long Term Goals - 10/03/18 1336      PT LONG TERM GOAL #1   Title  Pt to be independent with final HEP for R shoulder and back    Time  6    Period  Weeks    Status  New    Target Date  11/12/18      PT LONG TERM GOAL #2   Title  Pt to report decreased pain in R shoulder to 0-2/10 with activity    Time  6    Period  Weeks    Status  New    Target Date  11/12/18      PT LONG TERM GOAL #3   Title  Pt to demo improved strength of R shoulder to at least 4+/5 to improve ability for reaching , lifting, and IADLS.      PT LONG TERM GOAL #4   Title  Pt to demo AROM of R shoulder to be WNL to improve ability for ADLs and IADLS.    Time  6    Period  Weeks    Status  New    Target Date  11/12/18      PT LONG TERM GOAL #5   Title  Pt to report decreased pain in low back and R LE to be 0-3/10 with standing activity.    Time  6    Period  Weeks    Status  New  Target Date  11/12/18            Plan - 10/08/18 0917    Clinical Impression Statement  Pt with slight improvments of PROM for R shoulder in all directions. He does have stiffness and pain at end of available ranges. Also improved ability for AROM, up to 120 deg consistently today. Progressed ther ex for low back, with education and practice for TA contraction. Pt to benefit from continued education and practice with this.    Examination-Activity Limitations  Locomotion Level;Reach Overhead;Bend;Carry;Squat;Stand;Lift    Examination-Participation Restrictions  Cleaning;Community Activity;Yard Work    [employer] officer  Evolving/Moderate complexity    Rehab Potential  Good    PT Frequency  2x / week    PT Duration  6 weeks    PT Treatment/Interventions  ADLs/Self Care Home  Management;Cryotherapy;Electrical Stimulation;Ultrasound;Traction;Moist Heat;Iontophoresis 4mg /ml Dexamethasone;Gait training;Stair training;Functional mobility training;Therapeutic activities;Therapeutic exercise;Balance training;Neuromuscular re-education;Manual techniques;Patient/family education;Passive range of motion;Dry needling;Spinal Manipulations;Joint Manipulations;Taping    Consulted and Agree with Plan of Care  Patient       Patient will benefit from skilled therapeutic intervention in order to improve the following deficits and impairments:  Decreased range of motion, Impaired UE functional use, Increased muscle spasms, Decreased endurance, Decreased activity tolerance, Pain, Improper body mechanics, Impaired flexibility, Hypomobility, Decreased mobility, Decreased strength  Visit Diagnosis: Acute pain of right shoulder  Stiffness of right shoulder, not elsewhere classified  Muscle weakness (generalized)  Chronic bilateral low back pain with left-sided sciatica     Problem List Patient Active Problem List   Diagnosis Date Noted  . Right shoulder tendinitis 09/09/2018  . AC (acromioclavicular) arthritis 09/09/2018  . GAD (generalized anxiety disorder) 05/07/2017  . Wheeze 09/23/2014  . Maxillary sinusitis 09/23/2014  . Acute bronchitis 09/23/2014  . Cough 09/23/2014  . Health maintenance examination 09/15/2014  . Local skin infection 12/17/2013  . Ankle edema 12/17/2013  . Type II or unspecified type diabetes mellitus without mention of complication, uncontrolled 06/16/2013  . Preventative health care 06/16/2013  . Hypertriglyceridemia 02/09/2013  . Chronic venous insufficiency 01/12/2013  . Prostate cancer screening 11/03/2012  . Colon cancer screening 11/03/2012  . HTN (hypertension) 09/28/2012  . Diabetes mellitus with complication ([place]) 99991111   Lyndee Hensen, PT, DPT 9:19 AM  10/08/18    [facility] [place], [place], [address] Phone: [phone]   Fax:  [phone]  Name: Brett Cruz MRN: [id number] Date of Birth: [birth year]

## 2018-10-13 ENCOUNTER — Encounter: Payer: Self-pay | Admitting: Physical Therapy

## 2018-10-13 ENCOUNTER — Ambulatory Visit: Payer: Federal, State, Local not specified - PPO | Admitting: Physical Therapy

## 2018-10-13 ENCOUNTER — Other Ambulatory Visit: Payer: Self-pay

## 2018-10-13 DIAGNOSIS — M6281 Muscle weakness (generalized): Secondary | ICD-10-CM

## 2018-10-13 DIAGNOSIS — M5442 Lumbago with sciatica, left side: Secondary | ICD-10-CM

## 2018-10-13 DIAGNOSIS — M25611 Stiffness of right shoulder, not elsewhere classified: Secondary | ICD-10-CM | POA: Diagnosis not present

## 2018-10-13 DIAGNOSIS — M25511 Pain in right shoulder: Secondary | ICD-10-CM

## 2018-10-13 DIAGNOSIS — G8929 Other chronic pain: Secondary | ICD-10-CM

## 2018-10-13 NOTE — Therapy (Signed)
[facility] [address] [place], [place], [address] Phone: [phone]   Fax:  [phone]  Physical Therapy Treatment  Patient Details  Name: Brett Cruz MRN: [id number] Date of Birth: [birth year] Referring Provider (PT): Creig Hines   Encounter Date: 10/13/2018  PT End of Session - 10/13/18 0808    Visit Number  4    Number of Visits  12    Date for PT Re-Evaluation  11/12/18    Authorization Type  BCBS    PT Start Time  0804    PT Stop Time  0847    PT Time Calculation (min)  43 min    Activity Tolerance  Patient tolerated treatment well    Behavior During Therapy  [facility] for tasks assessed/performed       Past Medical History:  Diagnosis Date  . Arthritis of right acromioclavicular joint    steroid inj 09/2018 (Dr. Tamala Julian)  . Cataract   . Chronic venous insufficiency   . Diverticulitis   . 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 Zigmund Daniel in [place].  . Glaucoma   . High triglycerides   . Hypertension approx 2010  . Hypertensive retinopathy of both eyes    Dr. Zigmund Daniel  . Mood disorder ([place])    dep/anx  . Obesity, Class I, BMI 30-34.9   . Right rotator cuff tendinitis     Past Surgical History:  Procedure Laterality Date  . ANTERIOR CRUCIATE LIGAMENT REPAIR  1985  . COLONOSCOPY  2011   diverticulosis but no polyps: recall 80yrs  . [place] SURGERY  1999  . TONSILECTOMY, ADENOIDECTOMY, BILATERAL MYRINGOTOMY AND TUBES  1966    There were no vitals filed for this visit.  Subjective Assessment - 10/13/18 0806    Subjective  Pt states slight increased soreness in last few days, but thinks shoulder is moving better.    Currently in Pain?  Yes    Pain Score  3     Pain Location  Shoulder    Pain Orientation  Right    Pain Descriptors / Indicators  Aching    Pain Type  Acute pain    Pain Onset  More than a month ago    Pain Frequency  Intermittent    Pain Score  3    Pain Location  Back    Pain Orientation  Right;Left    Pain Descriptors / Indicators  Aching;Tightness    Pain Type  Chronic pain    Pain Onset  More than a month ago    Pain Frequency  Intermittent                       [facility] Adult PT Treatment/Exercise - 10/13/18 0802      Exercises   Exercises  Shoulder;Lumbar      Lumbar Exercises: Stretches   Active Hamstring Stretch  --    Active Hamstring Stretch Limitations  --    Single Knee to Chest Stretch  2 reps;30 seconds    Piriformis Stretch  2 reps;30 seconds    Piriformis Stretch Limitations  supine mod fig 4;     Other Lumbar Stretch Exercise  seated flexion 30 sec x2;       Lumbar Exercises: Standing   Other Standing Lumbar Exercises  std march x20;       Lumbar Exercises: Supine   Ab Set  20 reps;5 seconds  Pelvic Tilt  20 reps    Clam  --    Clam Limitations  --    Bent Knee Raise  20 reps    Bent Knee Raise Limitations  with TA    Bridge  20 reps      Shoulder Exercises: Supine   External Rotation  --    External Rotation Limitations  --    Flexion  15 reps;AAROM    Flexion Limitations  cane       Shoulder Exercises: Sidelying   External Rotation  20 reps    External Rotation Weight (lbs)  2      Shoulder Exercises: Standing   Flexion  AROM;20 reps    Flexion Limitations  to 120 deg    ABduction  20 reps    ABduction Limitations  to 120 deg    Row  20 reps    Theraband Level (Shoulder Row)  Level 3 (Green)      Shoulder Exercises: Pulleys   Flexion  2 minutes    ABduction  2 minutes      Shoulder Exercises: Stretch   Corner Stretch  20 seconds;2 reps    Corner Stretch Limitations  45 deg       Manual Therapy   Manual Therapy  Joint mobilization;Passive ROM;Manual Traction    Joint Mobilization  GHJ mobs Inf and Post gr 3    Passive ROM  R shoulder, all motions    Manual Traction  --               PT Short Term Goals - 10/03/18 1335      PT SHORT  TERM GOAL #1   Title  Pt to be independent with initial HEP for R shoulder and back    Time  2    Period  Weeks    Status  New    Target Date  10/15/18      PT SHORT TERM GOAL #2   Title  Pt to report decreased pain in R shoulder, to 3/10 with activity    Time  2    Period  Weeks    Status  New    Target Date  10/15/18        PT Long Term Goals - 10/03/18 1336      PT LONG TERM GOAL #1   Title  Pt to be independent with final HEP for R shoulder and back    Time  6    Period  Weeks    Status  New    Target Date  11/12/18      PT LONG TERM GOAL #2   Title  Pt to report decreased pain in R shoulder to 0-2/10 with activity    Time  6    Period  Weeks    Status  New    Target Date  11/12/18      PT LONG TERM GOAL #3   Title  Pt to demo improved strength of R shoulder to at least 4+/5 to improve ability for reaching , lifting, and IADLS.      PT LONG TERM GOAL #4   Title  Pt to demo AROM of R shoulder to be WNL to improve ability for ADLs and IADLS.    Time  6    Period  Weeks    Status  New    Target Date  11/12/18      PT LONG TERM GOAL #5   Title  Pt to report decreased pain in low back and R LE to be 0-3/10 with standing activity.    Time  6    Period  Weeks    Status  New    Target Date  11/12/18            Plan - 10/13/18 0859    Clinical Impression Statement  Pt with slight improvments of shoulder ROM at each session. Improved ability for AROM today as well. Still tight and sore at end range but pain improivng with PROM at end range as motion is improving. Continued education on TA contraction and its importance. Pt with tendency for APT. Pt to benefit from continued care.    Examination-Activity Limitations  Locomotion Level;Reach Overhead;Bend;Carry;Squat;Stand;Lift    Examination-Participation Restrictions  Cleaning;Community Activity;Yard Work    [employer] officer  Evolving/Moderate complexity    Rehab Potential  Good    PT  Frequency  2x / week    PT Duration  6 weeks    PT Treatment/Interventions  ADLs/Self Care Home Management;Cryotherapy;Electrical Stimulation;Ultrasound;Traction;Moist Heat;Iontophoresis 4mg /ml Dexamethasone;Gait training;Stair training;Functional mobility training;Therapeutic activities;Therapeutic exercise;Balance training;Neuromuscular re-education;Manual techniques;Patient/family education;Passive range of motion;Dry needling;Spinal Manipulations;Joint Manipulations;Taping    Consulted and Agree with Plan of Care  Patient       Patient will benefit from skilled therapeutic intervention in order to improve the following deficits and impairments:  Decreased range of motion, Impaired UE functional use, Increased muscle spasms, Decreased endurance, Decreased activity tolerance, Pain, Improper body mechanics, Impaired flexibility, Hypomobility, Decreased mobility, Decreased strength  Visit Diagnosis: Acute pain of right shoulder  Stiffness of right shoulder, not elsewhere classified  Muscle weakness (generalized)  Chronic bilateral low back pain with left-sided sciatica     Problem List Patient Active Problem List   Diagnosis Date Noted  . Right shoulder tendinitis 09/09/2018  . AC (acromioclavicular) arthritis 09/09/2018  . GAD (generalized anxiety disorder) 05/07/2017  . Wheeze 09/23/2014  . Maxillary sinusitis 09/23/2014  . Acute bronchitis 09/23/2014  . Cough 09/23/2014  . Health maintenance examination 09/15/2014  . Local skin infection 12/17/2013  . Ankle edema 12/17/2013  . Type II or unspecified type diabetes mellitus without mention of complication, uncontrolled 06/16/2013  . Preventative health care 06/16/2013  . Hypertriglyceridemia 02/09/2013  . Chronic venous insufficiency 01/12/2013  . Prostate cancer screening 11/03/2012  . Colon cancer screening 11/03/2012  . HTN (hypertension) 09/28/2012  . Diabetes mellitus with complication ([place]) [id number]    Lyndee Hensen, PT, DPT 9:00 AM  10/13/18    [facility] [place], [place], [address] Phone: [phone]   Fax:  [phone]  Name: Brett Cruz MRN: [id number] Date of Birth: 1960/04/26

## 2018-10-15 ENCOUNTER — Other Ambulatory Visit: Payer: Self-pay

## 2018-10-15 ENCOUNTER — Ambulatory Visit: Payer: Federal, State, Local not specified - PPO | Admitting: Physical Therapy

## 2018-10-15 ENCOUNTER — Encounter: Payer: Self-pay | Admitting: Physical Therapy

## 2018-10-15 DIAGNOSIS — M25611 Stiffness of right shoulder, not elsewhere classified: Secondary | ICD-10-CM | POA: Diagnosis not present

## 2018-10-15 DIAGNOSIS — G8929 Other chronic pain: Secondary | ICD-10-CM

## 2018-10-15 DIAGNOSIS — M6281 Muscle weakness (generalized): Secondary | ICD-10-CM | POA: Diagnosis not present

## 2018-10-15 DIAGNOSIS — M25511 Pain in right shoulder: Secondary | ICD-10-CM | POA: Diagnosis not present

## 2018-10-15 DIAGNOSIS — M5442 Lumbago with sciatica, left side: Secondary | ICD-10-CM | POA: Diagnosis not present

## 2018-10-15 NOTE — Therapy (Signed)
[facility] [address] [place], [place], [address] Phone: [phone]   Fax:  [phone]  Physical Therapy Treatment  Patient Details  Name: Brett Cruz MRN: [id number] Date of Birth: August 20, 1960 Referring Provider (PT): Creig Hines   Encounter Date: 10/15/2018  PT End of Session - 10/15/18 0856    Visit Number  5    Number of Visits  12    Date for PT Re-Evaluation  11/12/18    Authorization Type  [employer]    PT Start Time  0806    PT Stop Time  0848    PT Time Calculation (min)  42 min    Activity Tolerance  Patient tolerated treatment well    Behavior During Therapy  [facility] for tasks assessed/performed       Past Medical History:  Diagnosis Date  . Arthritis of right acromioclavicular joint    steroid inj 09/2018 (Dr. Tamala Julian)  . Cataract   . Chronic venous insufficiency   . Diverticulitis   . 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 Zigmund Daniel in [place].  . Glaucoma   . High triglycerides   . Hypertension approx 2010  . Hypertensive retinopathy of both eyes    Dr. Zigmund Daniel  . Mood disorder ([place])    dep/anx  . Obesity, Class I, BMI 30-34.9   . Right rotator cuff tendinitis     Past Surgical History:  Procedure Laterality Date  . ANTERIOR CRUCIATE LIGAMENT REPAIR  1985  . COLONOSCOPY  2011   diverticulosis but no polyps: recall 22yrs  . [place] SURGERY  1999  . TONSILECTOMY, ADENOIDECTOMY, BILATERAL MYRINGOTOMY AND TUBES  1966    There were no vitals filed for this visit.  Subjective Assessment - 10/15/18 0900    Subjective  Pt with no new complaints. He is going out of town next week, will not be at PT.    Currently in Pain?  Yes    Pain Score  3     Pain Location  Shoulder    Pain Orientation  Right    Pain Descriptors / Indicators  Aching    Pain Type  Acute pain    Pain Score  3    Pain Location  Back    Pain Orientation  Left;Right    Pain  Descriptors / Indicators  Aching;Tightness    Pain Type  Chronic pain    Pain Onset  More than a month ago    Pain Frequency  Intermittent         [facility] PT Assessment - 10/15/18 0001      AROM   Right Shoulder Flexion  135 Degrees                   [facility] Adult PT Treatment/Exercise - 10/15/18 0001      Exercises   Exercises  Shoulder;Lumbar      Lumbar Exercises: Stretches   Single Knee to Chest Stretch  --    Piriformis Stretch  --    Piriformis Stretch Limitations  --    Other Lumbar Stretch Exercise  --      Lumbar Exercises: Standing   Other Standing Lumbar Exercises  --      Lumbar Exercises: Supine   Ab Set  --    Pelvic Tilt  --    Bent Knee Raise  --    Bent Knee Raise Limitations  --  Bridge  --      Shoulder Exercises: Supine   Flexion  15 reps;AAROM    Flexion Limitations  cane 2lb      Shoulder Exercises: Sidelying   External Rotation  20 reps    External Rotation Weight (lbs)  2      Shoulder Exercises: Standing   External Rotation  20 reps    Theraband Level (Shoulder External Rotation)  Level 3 (Green)    Internal Rotation  20 reps    Theraband Level (Shoulder Internal Rotation)  Level 3 (Green)    Flexion  AROM;10 reps    Flexion Limitations  --    ABduction  10 reps;AROM    ABduction Limitations  --    Row  20 reps    Theraband Level (Shoulder Row)  Level 3 (Green)      Shoulder Exercises: Pulleys   Flexion  2 minutes    ABduction  2 minutes      Shoulder Exercises: ROM/Strengthening   UBE (Upper Arm Bike)  90 RPE x 4 min (fwd/bwd)       Shoulder Exercises: Stretch   Corner Stretch  --    Warehouse manager Limitations  --    Internal Rotation Stretch  5 reps    Internal Rotation Stretch Limitations  Behind the back with manua mobs      Manual Therapy   Manual Therapy  Joint mobilization;Passive ROM;Manual Traction    Joint Mobilization  GHJ mobs Inf and Post gr 3, Standing MWM for IR behind back.    Passive ROM  R  shoulder, all motions,  Manually resisted flex/ext and D2 flex/ex x15 each;                PT Short Term Goals - 10/03/18 1335      PT SHORT TERM GOAL #1   Title  Pt to be independent with initial HEP for R shoulder and back    Time  2    Period  Weeks    Status  New    Target Date  10/15/18      PT SHORT TERM GOAL #2   Title  Pt to report decreased pain in R shoulder, to 3/10 with activity    Time  2    Period  Weeks    Status  New    Target Date  10/15/18        PT Long Term Goals - 10/03/18 1336      PT LONG TERM GOAL #1   Title  Pt to be independent with final HEP for R shoulder and back    Time  6    Period  Weeks    Status  New    Target Date  11/12/18      PT LONG TERM GOAL #2   Title  Pt to report decreased pain in R shoulder to 0-2/10 with activity    Time  6    Period  Weeks    Status  New    Target Date  11/12/18      PT LONG TERM GOAL #3   Title  Pt to demo improved strength of R shoulder to at least 4+/5 to improve ability for reaching , lifting, and IADLS.      PT LONG TERM GOAL #4   Title  Pt to demo AROM of R shoulder to be WNL to improve ability for ADLs and IADLS.    Time  6  Period  Weeks    Status  New    Target Date  11/12/18      PT LONG TERM GOAL #5   Title  Pt to report decreased pain in low back and R LE to be 0-3/10 with standing activity.    Time  6    Period  Weeks    Status  New    Target Date  11/12/18            Plan - 10/15/18 0901    Clinical Impression Statement  Pt with improving ability for AROM, still lacking full PROM due to GHJ stiffness. Strength improving at neutral, but still challenged with strength at higher levels for elevation. Pt with improving function, but will benefit from continued care for full PROM, and strength, as well as lumbar stabilization and HEP. Pt will be away next week, but recommended her return after that.    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/Moderate complexity    Rehab Potential  Good    PT Frequency  2x / week    PT Duration  6 weeks    PT Treatment/Interventions  ADLs/Self Care Home Management;Cryotherapy;Electrical Stimulation;Ultrasound;Traction;Moist Heat;Iontophoresis 4mg /ml Dexamethasone;Gait training;Stair training;Functional mobility training;Therapeutic activities;Therapeutic exercise;Balance training;Neuromuscular re-education;Manual techniques;Patient/family education;Passive range of motion;Dry needling;Spinal Manipulations;Joint Manipulations;Taping    Consulted and Agree with Plan of Care  Patient       Patient will benefit from skilled therapeutic intervention in order to improve the following deficits and impairments:  Decreased range of motion, Impaired UE functional use, Increased muscle spasms, Decreased endurance, Decreased activity tolerance, Pain, Improper body mechanics, Impaired flexibility, Hypomobility, Decreased mobility, Decreased strength  Visit Diagnosis: Acute pain of right shoulder  Stiffness of right shoulder, not elsewhere classified  Muscle weakness (generalized)  Chronic bilateral low back pain with left-sided sciatica     Problem List Patient Active Problem List   Diagnosis Date Noted  . Right shoulder tendinitis 09/09/2018  . AC (acromioclavicular) arthritis 09/09/2018  . GAD (generalized anxiety disorder) 05/07/2017  . Wheeze 09/23/2014  . Maxillary sinusitis 09/23/2014  . Acute bronchitis 09/23/2014  . Cough 09/23/2014  . Health maintenance examination 09/15/2014  . Local skin infection 12/17/2013  . Ankle edema 12/17/2013  . Type II or unspecified type diabetes mellitus without mention of complication, uncontrolled 06/16/2013  . Preventative health care 06/16/2013  . Hypertriglyceridemia 02/09/2013  .  Chronic venous insufficiency 01/12/2013  . Prostate cancer screening 11/03/2012  . Colon cancer screening 11/03/2012  . HTN (hypertension) 09/28/2012  . Diabetes mellitus with complication ([place]) [id number]    Lyndee Hensen, PT, DPT 12:09 PM  10/15/18    [facility] [place], [place], [address] Phone: [phone]   Fax:  [phone]  Name: Slater Gettler MRN: [id number] Date of Birth: May 04, 1960

## 2018-10-26 ENCOUNTER — Encounter: Payer: Self-pay | Admitting: *Deleted

## 2018-10-27 ENCOUNTER — Encounter: Payer: Federal, State, Local not specified - PPO | Admitting: Physical Therapy

## 2018-10-27 ENCOUNTER — Ambulatory Visit: Payer: Federal, State, Local not specified - PPO | Admitting: Internal Medicine

## 2018-10-27 ENCOUNTER — Other Ambulatory Visit: Payer: Self-pay

## 2018-10-27 ENCOUNTER — Encounter: Payer: Self-pay | Admitting: Internal Medicine

## 2018-10-27 VITALS — BP 124/76 | HR 68 | Temp 97.5°F | Ht 79.0 in | Wt 290.1 lb

## 2018-10-27 DIAGNOSIS — Z1211 Encounter for screening for malignant neoplasm of colon: Secondary | ICD-10-CM

## 2018-10-27 DIAGNOSIS — Z8719 Personal history of other diseases of the digestive system: Secondary | ICD-10-CM

## 2018-10-27 DIAGNOSIS — K5730 Diverticulosis of large intestine without perforation or abscess without bleeding: Secondary | ICD-10-CM

## 2018-10-27 MED ORDER — SUPREP BOWEL PREP KIT 17.5-3.13-1.6 GM/177ML PO SOLN: 1.0000 | ORAL | 0 refills | Status: DC

## 2018-10-27 NOTE — Progress Notes (Signed)
Patient ID: Brett Cruz, male   DOB: [birth year], 59 y.o.   MRN: [id number] HPI: Brett Cruz is a 58 yo male with PMH of diverticulosis and diverticulitis, DM, HTN who is seen in consultation at the request of Dr. Ernestine Conrad to discuss colon cancer screening.  He is here alone today.  This is our first visit.  He reports that he is feeling well.  He had a colonoscopy greater than 10 years ago in [place].  He recalls this being normal with the exception of diverticulosis.  He feels that this was done before age 43 due to his history of diverticulitis.  He has had 1 or 2 episodes in the last 7 years.  He denies change in bowel habit.  No blood in his stool or melena.  Bowel movements are regular.  No troublesome diarrhea or constipation.  No abdominal pain.  He denies upper GI and hepatobiliary complaint.  No family history of colon cancer.  His mother and grandmother had breast cancer.  He does have a family history of heart disease and diabetes.  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  He is married.  Never a tobacco user.  May have 1 drink per month.  Past Medical History:  Diagnosis Date  . Arthritis of right acromioclavicular joint    steroid inj 09/2018 (Dr. Tamala Julian)  . Cataract   . Chronic venous insufficiency   . Diverticulitis   . Diverticulosis 2006?   Dr Lynnell Chad  . 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 Zigmund Daniel in [place].  Marland Kitchen GAD (generalized anxiety disorder)   . Glaucoma   . High triglycerides   . Hypertension approx 2010  . Hypertensive retinopathy of both eyes    Dr. Zigmund Daniel  . Mood disorder ([place])    dep/anx  . Obesity, Class I, BMI 30-34.9   . Right rotator cuff tendinitis     Past Surgical History:  Procedure Laterality Date  . ANTERIOR CRUCIATE LIGAMENT REPAIR  1985  . COLONOSCOPY  2011   diverticulosis but no polyps: recall 48yrs  . HERNIA REPAIR     age 31 and 58 years old  . [place] SURGERY   1999  . TONSILECTOMY, ADENOIDECTOMY, BILATERAL MYRINGOTOMY AND TUBES  1966    Outpatient Medications Prior to Visit  Medication Sig Dispense Refill  . amLODipine (NORVASC) 10 MG tablet TAKE 1 TABLET BY MOUTH DAILY 90 tablet 1  . Diclofenac Sodium (PENNSAID) 2 % SOLN Place 2 g onto the skin 2 (two) times daily. 112 g 3  . escitalopram (LEXAPRO) 10 MG tablet TAKE 1 TABLET BY MOUTH DAILY 30 tablet 5  . fenofibrate (TRICOR) 145 MG tablet TAKE 1 TABLET BY MOUTH DAILY 90 tablet 1  . Glucosamine-Chondroitin (OSTEO BI-FLEX REGULAR STRENGTH PO) Take 1 tablet by mouth daily.    Marland Kitchen glucose blood test strip Use as instructed 100 each 12  . Lancets 30G MISC Use as directed to check blood sugar 100 each 11  . LORazepam (ATIVAN) 0.5 MG tablet TAKE 1 TO 2 TABLETS BY MOUTH TWICE DAILY AS NEEDED FOR ANXIETY 60 tablet 5  . metFORMIN (GLUCOPHAGE-XR) 500 MG 24 hr tablet TAKE 2 TABLETS BY MOUTH TWICE DAILY 360 tablet 1  . metoprolol succinate (TOPROL-XL) 25 MG 24 hr tablet TAKE 1 TABLET BY MOUTH DAILY 90 tablet 1  . Multiple Vitamin (MULTIVITAMIN) tablet Take 1 tablet by mouth daily.    . Omega-3  Fatty Acids (FISH OIL PO) Take 1 capsule by mouth daily.    . pioglitazone (ACTOS) 45 MG tablet Take 1 tablet (45 mg total) by mouth daily. 30 tablet 5  . Psyllium Fiber 0.52 g CAPS Take 1 capsule by mouth daily.    Marland Kitchen telmisartan (MICARDIS) 80 MG tablet TAKE 1 TABLET BY MOUTH DAILY 90 tablet 1  . aspirin EC 81 MG tablet Take 81 mg by mouth daily.    Marland Kitchen GLUCOSAMINE-CHONDROITIN PO Take 1 capsule by mouth daily.     No facility-administered medications prior to visit.     No Known Allergies  Family History  Problem Relation Age of Onset  . Breast cancer Mother   . Cancer Father   . Heart disease Father   . Heart disease Brother   . Colon cancer Neg Hx   . Esophageal cancer Neg Hx     Social History   Tobacco Use  . Smoking status: Never Smoker  . Smokeless tobacco: Never Used  Substance Use Topics  .  Alcohol use: Yes    Comment: socially  . Drug use: No    ROS: As per history of present illness, otherwise negative  BP 124/76   Pulse 68   Temp (!) 97.5 F (36.4 C)   Ht 6\' 7"  (2.007 m)   Wt 290 lb 2 oz (131.6 kg)   BMI 32.68 kg/m  Constitutional: Well-developed and well-nourished. No distress. HEENT: Normocephalic and atraumatic. Conjunctivae are normal.  No scleral icterus. Neck: Neck supple. Trachea midline. Cardiovascular: Normal rate, regular rhythm and intact distal pulses. No M/R/G Pulmonary/chest: Effort normal and breath sounds normal. No wheezing, rales or rhonchi. Abdominal: Soft, obese, nontender, nondistended. Bowel sounds active throughout. There are no masses palpable. No hepatosplenomegaly. Extremities: no clubbing, cyanosis, or edema Neurological: Alert and oriented to person place and time. Skin: Skin is warm and dry.  Psychiatric: Normal mood and affect. Behavior is normal.  RELEVANT LABS AND IMAGING: CBC    Component Value Date/Time   WBC 6.4 09/22/2018 1002   RBC 4.79 09/22/2018 1002   HGB 14.9 09/22/2018 1002   HCT 43.7 09/22/2018 1002   PLT 223.0 09/22/2018 1002   MCV 91.4 09/22/2018 1002   MCH 30.6 08/23/2016 1039   MCHC 34.0 09/22/2018 1002   RDW 13.1 09/22/2018 1002   LYMPHSABS 2.5 09/22/2018 1002   MONOABS 0.5 09/22/2018 1002   EOSABS 0.1 09/22/2018 1002   BASOSABS 0.2 (H) 09/22/2018 1002    CMP     Component Value Date/Time   NA 137 09/22/2018 1002   K 4.3 09/22/2018 1002   CL 102 09/22/2018 1002   CO2 27 09/22/2018 1002   GLUCOSE 189 (H) 09/22/2018 1002   BUN 13 09/22/2018 1002   CREATININE 0.63 09/22/2018 1002   CALCIUM 9.3 09/22/2018 1002   PROT 7.0 09/22/2018 1002   ALBUMIN 4.4 09/22/2018 1002   AST 15 09/22/2018 1002   ALT 21 09/22/2018 1002   ALKPHOS 70 09/22/2018 1002   BILITOT 0.6 09/22/2018 1002   GFRNONAA >60 08/23/2016 1039   GFRAA >60 08/23/2016 1039    ASSESSMENT/PLAN: 58 yo male with PMH of diverticulosis  and diverticulitis, DM, HTN who is seen in consultation at the request of Dr. Ernestine Conrad to discuss colon cancer screening.   1.  Colon cancer screening --it has been greater than 10 years since his last colonoscopy.  Screening colonoscopy is due and recommended at this time.  We discussed the risk, benefits and alternatives and  he is agreeable and wishes to proceed.  2.  History of diverticulitis --not an issue of late.  No abdominal pain.  He will monitor for recurrent abdominal pain and let me or Dr. Ernestine Conrad know should this recur.   [id number], Adrian [place], [place] [address],  [place] [address]

## 2018-10-27 NOTE — Patient Instructions (Signed)
You have been scheduled for a colonoscopy. Please follow written instructions given to you at your visit today.  Please pick up your prep supplies at the pharmacy within the next 1-3 days. If you use inhalers (even only as needed), please bring them with you on the day of your procedure. Your physician has requested that you go to www.startemmi.com and enter the access code given to you at your visit today. This web site gives a general overview about your procedure. However, you should still follow specific instructions given to you by our office regarding your preparation for the procedure.  If you are age 16 or older, your body mass index should be between 23-30. Your Body mass index is 32.68 kg/m. If this is out of the aforementioned range listed, please consider follow up with your Primary Care Provider.  If you are age 24 or younger, your body mass index should be between 19-25. Your Body mass index is 32.68 kg/m. If this is out of the aformentioned range listed, please consider follow up with your Primary Care Provider.

## 2018-10-28 ENCOUNTER — Encounter: Payer: Self-pay | Admitting: Family Medicine

## 2018-10-28 ENCOUNTER — Ambulatory Visit: Payer: Self-pay

## 2018-10-28 ENCOUNTER — Ambulatory Visit (INDEPENDENT_AMBULATORY_CARE_PROVIDER_SITE_OTHER): Payer: Federal, State, Local not specified - PPO | Admitting: Family Medicine

## 2018-10-28 VITALS — BP 120/84 | HR 75 | Ht 79.0 in | Wt 290.0 lb

## 2018-10-28 DIAGNOSIS — M7581 Other shoulder lesions, right shoulder: Secondary | ICD-10-CM | POA: Diagnosis not present

## 2018-10-28 DIAGNOSIS — M778 Other enthesopathies, not elsewhere classified: Secondary | ICD-10-CM

## 2018-10-28 DIAGNOSIS — G8929 Other chronic pain: Secondary | ICD-10-CM

## 2018-10-28 DIAGNOSIS — M19011 Primary osteoarthritis, right shoulder: Secondary | ICD-10-CM

## 2018-10-28 DIAGNOSIS — M25511 Pain in right shoulder: Secondary | ICD-10-CM

## 2018-10-28 NOTE — Assessment & Plan Note (Signed)
Patient given injection today, tolerated the procedure well.  Discussed icing regimen and home exercise, discussed which activities of doing which wants to avoid.  Increase activity as tolerated.  Patient is encouraged to continue with physical therapy.  Hoping that this injection helps the rest of the way.  Did respond fairly well to the acromioclavicular injection previously.

## 2018-10-28 NOTE — Progress Notes (Signed)
[name] [address] [place], [place] [address] Phone: [phone] Subjective:   Fontaine No, am serving as a scribe for Dr. Hulan Saas.     CC: Shoulder pain follow-up  [id number]   09/10/2018 Patient given injection today.  This will be diagnostic as well as therapeutic.  I do not think that this is the main component of his pain but I did feel that it likely is what is keeping him up at night.  We discussed topical anti-inflammatories and icing regimen.  Follow-up again in 4 to 8 weeks  Update 10/28/2018 Brett Cruz is a 58 y.o. male coming in with complaint of right shoulder pain. Is better from physical therapy and the injection. Flexion has improved. Has sharp pain occasionally with adduction, extension, IR and when he is lying on that arm.  Patient would state overall feeling about 70% better.  Feels like working with physical therapy is what is helping more than anything else at the moment.    Past Medical History:  Diagnosis Date  . Arthritis of right acromioclavicular joint    steroid inj 09/2018 (Dr. Tamala Julian)  . Cataract   . Chronic venous insufficiency   . Diverticulitis   . Diverticulosis 2006?   Dr Lynnell Chad  . 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 Zigmund Daniel in [place].  Marland Kitchen GAD (generalized anxiety disorder)   . Glaucoma   . High triglycerides   . Hypertension approx 2010  . Hypertensive retinopathy of both eyes    Dr. Zigmund Daniel  . Mood disorder ([place])    dep/anx  . Obesity, Class I, BMI 30-34.9   . Right rotator cuff tendinitis    Past Surgical History:  Procedure Laterality Date  . ANTERIOR CRUCIATE LIGAMENT REPAIR  1985  . COLONOSCOPY  2011   diverticulosis but no polyps: recall 89yr  . HERNIA REPAIR     age [age]and 58years old  [place]  . TONSILECTOMY, ADENOIDECTOMY, BILATERAL MYRINGOTOMY AND TUBES  1966   Social History    Socioeconomic History  . Marital status: Married    Spouse name: Not on file  . Number of children: Not on file  . Years of education: Not on file  . Highest education level: Not on file  Occupational History  . Not on file  Social Needs  . Financial resource strain: Not on file  . Food insecurity    Worry: Not on file    Inability: Not on file  . Transportation needs    Medical: Not on file    Non-medical: Not on file  Tobacco Use  . Smoking status: Never Smoker  . Smokeless tobacco: Never Used  Substance and Sexual Activity  . Alcohol use: Yes    Comment: socially  . Drug use: No  . Sexual activity: Not on file  Lifestyle  . Physical activity    Days per week: Not on file    Minutes per session: Not on file  . Stress: Not on file  Relationships  . Social [place] phone: Not on file    Gets together: Not on file    Attends religious service: Not on file    Active member of club or organization: Not on file    Attends meetings of clubs or organizations: Not on file    Relationship status: Not on file  Other Topics  Concern  . Not on file  Social History Narrative   Married, 2 college age sons.   Orig from [place].   Relocated to [facility] 2013.   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   No tobacco or drugs.  Alcohol: occasional.   No Known Allergies Family History  Problem Relation Age of Onset  . Breast cancer Mother   . Cancer Father   . Heart disease Father   . Heart disease Brother   . Colon cancer Neg Hx   . Esophageal cancer Neg Hx     Current Outpatient Medications (Endocrine & Metabolic):  .  metFORMIN (GLUCOPHAGE-XR) 500 MG 24 hr tablet, TAKE 2 TABLETS BY MOUTH TWICE DAILY .  pioglitazone (ACTOS) 45 MG tablet, Take 1 tablet (45 mg total) by mouth daily.  Current Outpatient Medications (Cardiovascular):  .  amLODipine (NORVASC) 10 MG tablet, TAKE 1 TABLET BY MOUTH DAILY .  fenofibrate (TRICOR) 145  MG tablet, TAKE 1 TABLET BY MOUTH DAILY .  metoprolol succinate (TOPROL-XL) 25 MG 24 hr tablet, TAKE 1 TABLET BY MOUTH DAILY .  telmisartan (MICARDIS) 80 MG tablet, TAKE 1 TABLET BY MOUTH DAILY     Current Outpatient Medications (Other):  Marland Kitchen  Diclofenac Sodium (PENNSAID) 2 % SOLN, Place 2 g onto the skin 2 (two) times daily. Marland Kitchen  escitalopram (LEXAPRO) 10 MG tablet, TAKE 1 TABLET BY MOUTH DAILY .  Glucosamine-Chondroitin (OSTEO BI-FLEX REGULAR STRENGTH PO), Take 1 tablet by mouth daily. Marland Kitchen  glucose blood test strip, Use as instructed .  Lancets 30G MISC, Use as directed to check blood sugar .  LORazepam (ATIVAN) 0.5 MG tablet, TAKE 1 TO 2 TABLETS BY MOUTH TWICE DAILY AS NEEDED FOR ANXIETY .  Multiple Vitamin (MULTIVITAMIN) tablet, Take 1 tablet by mouth daily. .  Omega-3 Fatty Acids (FISH OIL PO), Take 1 capsule by mouth daily. .  Psyllium Fiber 0.52 g CAPS, Take 1 capsule by mouth daily. Manus Gunning BOWEL PREP KIT 17.5-3.13-1.6 GM/177ML SOLN, Take 1 kit by mouth as directed. For colonoscopy prep    Past medical history, social, surgical and family history all reviewed in electronic medical record.  No pertanent information unless stated regarding to the chief complaint.   Review of Systems:  No headache, visual changes, nausea, vomiting, diarrhea, constipation, dizziness, abdominal pain, skin rash, fevers, chills, night sweats, weight loss, swollen lymph nodes, body aches, joint swelling, muscle aches, chest pain, shortness of breath, mood changes.   Objective  Blood pressure 120/84, pulse 75, height _0  (2.007 m), weight 290 lb (131.5 kg), SpO2 98 %.   General: No apparent distress alert and oriented x3 mood and affect normal, dressed appropriately.  HEENT: Pupils equal, extraocular movements intact  Respiratory: Patient's speak in full sentences and does not appear short of breath  Cardiovascular: No lower extremity edema, non tender, no erythema  Skin: Warm dry intact with no signs  of infection or rash on extremities or on axial skeleton.  Abdomen: Soft nontender  Neuro: Cranial nerves II through XII are intact, neurovascularly intact in all extremities with 2+ DTRs and 2+ pulses.  Lymph: No lymphadenopathy of posterior or anterior cervical chain or axillae bilaterally.  Gait normal with good balance and coordination.  MSK:  tender with mild limited range of motion and good stability and symmetric strength and tone of  elbows, wrist, hip, knee and ankles bilaterally.  Shoulder: Right Inspection reveals no abnormalities, atrophy or asymmetry. Palpation is normal  with no tenderness over AC joint or bicipital groove. ROM is full in all planes passively. Rotator cuff strength normal throughout. signs of impingement with positive Neer and Hawkin's tests, but negative empty can sign. Speeds and Yergason's tests normal. No labral pathology noted with negative Obrien's, negative clunk and good stability. Normal scapular function observed. No painful arc and no drop arm sign. No apprehension sign    Procedure: Real-time Ultrasound Guided Injection of right glenohumeral joint Device: GE Logiq E  Ultrasound guided injection is preferred based studies that show increased duration, increased effect, greater accuracy, decreased procedural pain, increased response rate with ultrasound guided versus blind injection.  Verbal informed consent obtained.  Time-out conducted.  Noted no overlying erythema, induration, or other signs of local infection.  Skin prepped in a sterile fashion.  Local anesthesia: Topical Ethyl chloride.  With sterile technique and under real time ultrasound guidance:  Joint visualized.  23g 1  inch needle inserted posterior approach. Pictures taken for needle placement. Patient did have injection of 2 cc of 1% lidocaine, 2 cc of 0.5% Marcaine, and 1.0 cc of Kenalog 40 mg/dL. Completed without difficulty  Pain immediately resolved suggesting accurate  placement of the medication.  Advised to call if fevers/chills, erythema, induration, drainage, or persistent bleeding.  Images permanently stored and available for review in the ultrasound unit.  Impression: Technically successful ultrasound guided injection.    Impression and Recommendations:     This case required medical decision making of moderate complexity. The above documentation has been reviewed and is accurate and complete Lyndal Pulley, DO       Note: This dictation was prepared with Dragon dictation along with smaller phrase technology. Any transcriptional errors that result from this process are unintentional.        ;

## 2018-10-28 NOTE — Patient Instructions (Signed)
Injected the shoulder Can continue PT if you would like See me in 6 weeks

## 2018-10-29 ENCOUNTER — Other Ambulatory Visit: Payer: Self-pay

## 2018-10-29 ENCOUNTER — Encounter: Payer: Self-pay | Admitting: Physical Therapy

## 2018-10-29 ENCOUNTER — Ambulatory Visit (INDEPENDENT_AMBULATORY_CARE_PROVIDER_SITE_OTHER): Payer: Federal, State, Local not specified - PPO | Admitting: Physical Therapy

## 2018-10-29 DIAGNOSIS — G8929 Other chronic pain: Secondary | ICD-10-CM

## 2018-10-29 DIAGNOSIS — M6281 Muscle weakness (generalized): Secondary | ICD-10-CM

## 2018-10-29 DIAGNOSIS — M5442 Lumbago with sciatica, left side: Secondary | ICD-10-CM

## 2018-10-29 DIAGNOSIS — M25611 Stiffness of right shoulder, not elsewhere classified: Secondary | ICD-10-CM | POA: Diagnosis not present

## 2018-10-29 DIAGNOSIS — M25511 Pain in right shoulder: Secondary | ICD-10-CM | POA: Diagnosis not present

## 2018-10-29 MED ORDER — ESCITALOPRAM OXALATE 10 MG PO TABS: 10.0000 mg | ORAL_TABLET | Freq: Every day | ORAL | 5 refills | Status: DC

## 2018-10-29 NOTE — Therapy (Signed)
[facility] [address] [place], [place], [address] Phone: [phone]   Fax:  [phone]  Physical Therapy Treatment  Patient Details  Name: Brett Cruz MRN: [id number] Date of Birth: 21-Mar-1960 Referring Provider (PT): Creig Hines   Encounter Date: 10/29/2018  PT End of Session - 10/29/18 [id number]    Visit Number  6    Number of Visits  12    Date for PT Re-Evaluation  11/12/18    Authorization Type  BCBS    PT Start Time  0806    PT Stop Time  0845    PT Time Calculation (min)  39 min    Activity Tolerance  Patient tolerated treatment well    Behavior During Therapy  [facility] for tasks assessed/performed       Past Medical History:  Diagnosis Date  . Arthritis of right acromioclavicular joint    steroid inj 09/2018 (Dr. Tamala Julian)  . Cataract   . Chronic venous insufficiency   . Diverticulitis   . Diverticulosis 2006?   Dr Lynnell Chad  . 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 Zigmund Daniel in [place].  Marland Kitchen GAD (generalized anxiety disorder)   . Glaucoma   . High triglycerides   . Hypertension approx 2010  . Hypertensive retinopathy of both eyes    Dr. Zigmund Daniel  . Mood disorder ([place])    dep/anx  . Obesity, Class I, BMI 30-34.9   . Right rotator cuff tendinitis     Past Surgical History:  Procedure Laterality Date  . ANTERIOR CRUCIATE LIGAMENT REPAIR  1985  . COLONOSCOPY  2011   diverticulosis but no polyps: recall 70yrs  . HERNIA REPAIR     age 45 and 58 years old  . [place] SURGERY  1999  . TONSILECTOMY, ADENOIDECTOMY, BILATERAL MYRINGOTOMY AND TUBES  1966    There were no vitals filed for this visit.  Subjective Assessment - 10/29/18 0810    Subjective  Pt had injection yesterday (2nd one) for R shoulder. States minor soreness, but overall doing ok. Has been out of town, not seen in PT in last week or so.    Currently in Pain?  Yes    Pain Score  3     Pain  Location  Shoulder    Pain Orientation  Right    Pain Type  Acute pain    Pain Onset  More than a month ago    Pain Frequency  Intermittent         [facility] PT Assessment - 10/29/18 0001      AROM   Right Shoulder Flexion  135 Degrees    Right Shoulder ABduction  130 Degrees      PROM   Right Shoulder Flexion  150 Degrees                   [facility] Adult PT Treatment/Exercise - 10/29/18 0812      Exercises   Exercises  Shoulder;Lumbar      Shoulder Exercises: Supine   Flexion  AAROM;20 reps    Flexion Limitations  cane 2lb      Shoulder Exercises: Sidelying   External Rotation  --    External Rotation Weight (lbs)  --      Shoulder Exercises: Standing   External Rotation  20 reps    Theraband Level (Shoulder External Rotation)  Level 3 (Green)    Internal Rotation  20 reps  Theraband Level (Shoulder Internal Rotation)  Level 3 (Green)    Flexion  AROM;10 reps    ABduction  10 reps;AROM    Row  20 reps    Theraband Level (Shoulder Row)  Level 3 (Green)      Shoulder Exercises: Pulleys   Flexion  --    ABduction  --      Shoulder Exercises: ROM/Strengthening   UBE (Upper Arm Bike)  90 RPE x 4 min (fwd/bwd)       Shoulder Exercises: IT sales professional  20 seconds;2 reps    Corner Stretch Limitations  45 deg     Internal Rotation Stretch  5 reps    Internal Rotation Stretch Limitations  Behind the back with manua mobs      Manual Therapy   Manual Therapy  Joint mobilization;Passive ROM;Manual Traction    Joint Mobilization  GHJ mobs Inf and Post gr 3,     Passive ROM  R shoulder, all motions,                 PT Short Term Goals - 10/03/18 1335      PT SHORT TERM GOAL #1   Title  Pt to be independent with initial HEP for R shoulder and back    Time  2    Period  Weeks    Status  New    Target Date  10/15/18      PT SHORT TERM GOAL #2   Title  Pt to report decreased pain in R shoulder, to 3/10 with activity    Time  2    Period   Weeks    Status  New    Target Date  10/15/18        PT Long Term Goals - 10/03/18 1336      PT LONG TERM GOAL #1   Title  Pt to be independent with final HEP for R shoulder and back    Time  6    Period  Weeks    Status  New    Target Date  11/12/18      PT LONG TERM GOAL #2   Title  Pt to report decreased pain in R shoulder to 0-2/10 with activity    Time  6    Period  Weeks    Status  New    Target Date  11/12/18      PT LONG TERM GOAL #3   Title  Pt to demo improved strength of R shoulder to at least 4+/5 to improve ability for reaching , lifting, and IADLS.      PT LONG TERM GOAL #4   Title  Pt to demo AROM of R shoulder to be WNL to improve ability for ADLs and IADLS.    Time  6    Period  Weeks    Status  New    Target Date  11/12/18      PT LONG TERM GOAL #5   Title  Pt to report decreased pain in low back and R LE to be 0-3/10 with standing activity.    Time  6    Period  Weeks    Status  New    Target Date  11/12/18            Plan - 10/29/18 1211    Clinical Impression Statement  Pt with improving PROM and AROM for all motions. He has less pain at of available range with  PROM today. He is still lacking full GHJ motion for elevation, but overall is showing good progress. Pt to benefit from continued care, to improve joint stiffness, ROM, strength, as well as to review ther ex for back and core strengthening.    Examination-Activity Limitations  Locomotion Level;Reach Overhead;Bend;Carry;Squat;Stand;Lift    Examination-Participation Restrictions  Cleaning;Community Activity;Yard Work    Merchant navy officer  Evolving/Moderate complexity    Rehab Potential  Good    PT Frequency  2x / week    PT Duration  6 weeks    PT Treatment/Interventions  ADLs/Self Care Home Management;Cryotherapy;Electrical Stimulation;Ultrasound;Traction;Moist Heat;Iontophoresis 4mg /ml Dexamethasone;Gait training;Stair training;Functional mobility  training;Therapeutic activities;Therapeutic exercise;Balance training;Neuromuscular re-education;Manual techniques;Patient/family education;Passive range of motion;Dry needling;Spinal Manipulations;Joint Manipulations;Taping    Consulted and Agree with Plan of Care  Patient       Patient will benefit from skilled therapeutic intervention in order to improve the following deficits and impairments:  Decreased range of motion, Impaired UE functional use, Increased muscle spasms, Decreased endurance, Decreased activity tolerance, Pain, Improper body mechanics, Impaired flexibility, Hypomobility, Decreased mobility, Decreased strength  Visit Diagnosis: Acute pain of right shoulder  Stiffness of right shoulder, not elsewhere classified  Muscle weakness (generalized)  Chronic bilateral low back pain with left-sided sciatica     Problem List Patient Active Problem List   Diagnosis Date Noted  . Right shoulder tendinitis 09/09/2018  . AC (acromioclavicular) arthritis 09/09/2018  . GAD (generalized anxiety disorder) 05/07/2017  . Wheeze 09/23/2014  . Maxillary sinusitis 09/23/2014  . Acute bronchitis 09/23/2014  . Cough 09/23/2014  . Health maintenance examination 09/15/2014  . Local skin infection 12/17/2013  . Ankle edema 12/17/2013  . Type II or unspecified type diabetes mellitus without mention of complication, uncontrolled 06/16/2013  . Preventative health care 06/16/2013  . Hypertriglyceridemia 02/09/2013  . Chronic venous insufficiency 01/12/2013  . Prostate cancer screening 11/03/2012  . Colon cancer screening 11/03/2012  . HTN (hypertension) 09/28/2012  . Diabetes mellitus with complication ([place]) [id number]    Lyndee Hensen, PT, DPT 12:15 PM  10/29/18    [place], [place], [address] Phone: [phone]   Fax:  [phone]  Name: Brett Cruz MRN: [id number] Date of Birth: [birth year]

## 2018-11-03 ENCOUNTER — Encounter: Payer: Self-pay | Admitting: Physical Therapy

## 2018-11-03 ENCOUNTER — Ambulatory Visit: Payer: Federal, State, Local not specified - PPO | Admitting: Physical Therapy

## 2018-11-03 ENCOUNTER — Other Ambulatory Visit: Payer: Self-pay

## 2018-11-03 DIAGNOSIS — M5442 Lumbago with sciatica, left side: Secondary | ICD-10-CM | POA: Diagnosis not present

## 2018-11-03 DIAGNOSIS — M6281 Muscle weakness (generalized): Secondary | ICD-10-CM

## 2018-11-03 DIAGNOSIS — G8929 Other chronic pain: Secondary | ICD-10-CM

## 2018-11-03 DIAGNOSIS — M25611 Stiffness of right shoulder, not elsewhere classified: Secondary | ICD-10-CM | POA: Diagnosis not present

## 2018-11-03 DIAGNOSIS — M25511 Pain in right shoulder: Secondary | ICD-10-CM | POA: Diagnosis not present

## 2018-11-03 NOTE — Therapy (Signed)
[facility] [address] [place], [place], [address] Phone: [phone]   Fax:  [phone]  Physical Therapy Treatment  Patient Details  Name: Brett Cruz MRN: [id number] Date of Birth: 06-21-1960 Referring Provider (PT): Creig Hines   Encounter Date: 11/03/2018  PT End of Session - 11/03/18 0815    Visit Number  7    Number of Visits  12    Date for PT Re-Evaluation  11/12/18    Authorization Type  BCBS    PT Start Time  0805    PT Stop Time  0845    PT Time Calculation (min)  40 min    Activity Tolerance  Patient tolerated treatment well    Behavior During Therapy  [facility] for tasks assessed/performed       Past Medical History:  Diagnosis Date  . Arthritis of right acromioclavicular joint    steroid inj 09/2018 (Dr. Tamala Julian)  . Cataract   . Chronic venous insufficiency   . Diverticulitis   . Diverticulosis 2006?   Dr Lynnell Chad  . 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 Zigmund Daniel in [place].  Marland Kitchen GAD (generalized anxiety disorder)   . Glaucoma   . High triglycerides   . Hypertension approx 2010  . Hypertensive retinopathy of both eyes    Dr. Zigmund Daniel  . Mood disorder ([place])    dep/anx  . Obesity, Class I, BMI 30-34.9   . Right rotator cuff tendinitis     Past Surgical History:  Procedure Laterality Date  . ANTERIOR CRUCIATE LIGAMENT REPAIR  1985  . COLONOSCOPY  2011   diverticulosis but no polyps: recall 2yrs  . HERNIA REPAIR     age 18 and 58 years old  . [place] SURGERY  1999  . TONSILECTOMY, ADENOIDECTOMY, BILATERAL MYRINGOTOMY AND TUBES  1966    There were no vitals filed for this visit.  Subjective Assessment - 11/03/18 0813    Subjective  Pt with no new complaints. Shoulder, "has been ok" and back" foot gets numb if i stand for a long time cooking"    Limitations  Lifting;Standing;House hold activities    Patient Stated Goals  decreased pain in R  shoulder, back, and improved ability for exercise    Currently in Pain?  No/denies    Pain Score  0-No pain    Pain Location  --    Pain Orientation  --    Pain Descriptors / Indicators  --                       [facility] Adult PT Treatment/Exercise - 11/03/18 0816      Exercises   Exercises  Shoulder;Lumbar      Lumbar Exercises: Stretches   Active Hamstring Stretch  3 reps;30 seconds    Active Hamstring Stretch Limitations  seated    Other Lumbar Stretch Exercise  seated flexion 30 sec x2;       Lumbar Exercises: Supine   Bent Knee Raise  20 reps    Bent Knee Raise Limitations  with TA    Bridge  20 reps    Straight Leg Raise  20 reps    Other Supine Lumbar Exercises  Modified crunch x20;       Shoulder Exercises: Supine   Flexion  AAROM;20 reps    Flexion Limitations  cane 2lb      Shoulder Exercises: Standing   External  Rotation  20 reps    Theraband Level (Shoulder External Rotation)  Level 3 (Green)    Internal Rotation  20 reps    Theraband Level (Shoulder Internal Rotation)  Level 3 (Green)    Flexion  AROM;15 reps    ABduction  AROM;20 reps    ABduction Limitations  2lb to 90 deg    Row  20 reps    Theraband Level (Shoulder Row)  Level 3 (Green)      Shoulder Exercises: Pulleys   Flexion  2 minutes      Shoulder Exercises: ROM/Strengthening   UBE (Upper Arm Bike)  90 RPE x 4 min (fwd/bwd)       Shoulder Exercises: IT sales professional  20 seconds;2 reps    Corner Stretch Limitations  45 deg     Internal Rotation Stretch  --    Internal Rotation Stretch Limitations  --      Manual Therapy   Manual Therapy  Joint mobilization;Passive ROM;Manual Traction    Joint Mobilization  GHJ mobs Inf and Post gr 3,     Passive ROM  R shoulder, all motions,               PT Education - 11/03/18 0927    Education Details  HEp updated for shoulder and back    Person(s) Educated  Patient    Methods  Explanation;Demonstration;Tactile  cues;Verbal cues;Handout    Comprehension  Verbalized understanding;Returned demonstration;Verbal cues required;Tactile cues required;Need further instruction       PT Short Term Goals - 11/03/18 0928      PT SHORT TERM GOAL #1   Title  Pt to be independent with initial HEP for R shoulder and back    Time  2    Period  Weeks    Status  Achieved    Target Date  10/15/18      PT SHORT TERM GOAL #2   Title  Pt to report decreased pain in R shoulder, to 3/10 with activity    Time  2    Period  Weeks    Status  Achieved    Target Date  10/15/18        PT Long Term Goals - 10/03/18 1336      PT LONG TERM GOAL #1   Title  Pt to be independent with final HEP for R shoulder and back    Time  6    Period  Weeks    Status  New    Target Date  11/12/18      PT LONG TERM GOAL #2   Title  Pt to report decreased pain in R shoulder to 0-2/10 with activity    Time  6    Period  Weeks    Status  New    Target Date  11/12/18      PT LONG TERM GOAL #3   Title  Pt to demo improved strength of R shoulder to at least 4+/5 to improve ability for reaching , lifting, and IADLS.      PT LONG TERM GOAL #4   Title  Pt to demo AROM of R shoulder to be WNL to improve ability for ADLs and IADLS.    Time  6    Period  Weeks    Status  New    Target Date  11/12/18      PT LONG TERM GOAL #5   Title  Pt to report decreased pain in  low back and R LE to be 0-3/10 with standing activity.    Time  6    Period  Weeks    Status  New    Target Date  11/12/18            Plan - 11/03/18 [id number]    Clinical Impression Statement  Pt continues to have mild stiffness and pain at end range for flexion and IR. He is improving with strength and ability for AROM. upper/end ranges of AROM are fatiguing with repeated motions. Updated HEP for shoulder and back today. Reviewed back stretches and core strength, and its importance. Pt to benefit from continued care.    Examination-Activity Limitations   Locomotion Level;Reach Overhead;Bend;Carry;Squat;Stand;Lift    Examination-Participation Restrictions  Cleaning;Community Activity;Yard Work    Merchant [employer] officer  Evolving/Moderate complexity    Rehab Potential  Good    PT Frequency  2x / week    PT Duration  6 weeks    PT Treatment/Interventions  ADLs/Self Care Home Management;Cryotherapy;Electrical Stimulation;Ultrasound;Traction;Moist Heat;Iontophoresis 4mg /ml Dexamethasone;Gait training;Stair training;Functional mobility training;Therapeutic activities;Therapeutic exercise;Balance training;Neuromuscular re-education;Manual techniques;Patient/family education;Passive range of motion;Dry needling;Spinal Manipulations;Joint Manipulations;Taping    Consulted and Agree with Plan of Care  Patient       Patient will benefit from skilled therapeutic intervention in order to improve the following deficits and impairments:  Decreased range of motion, Impaired UE functional use, Increased muscle spasms, Decreased endurance, Decreased activity tolerance, Pain, Improper body mechanics, Impaired flexibility, Hypomobility, Decreased mobility, Decreased strength  Visit Diagnosis: Acute pain of right shoulder  Stiffness of right shoulder, not elsewhere classified  Muscle weakness (generalized)  Chronic bilateral low back pain with left-sided sciatica     Problem List Patient Active Problem List   Diagnosis Date Noted  . Right shoulder tendinitis 09/09/2018  . AC (acromioclavicular) arthritis 09/09/2018  . GAD (generalized anxiety disorder) 05/07/2017  . Wheeze 09/23/2014  . Maxillary sinusitis 09/23/2014  . Acute bronchitis 09/23/2014  . Cough 09/23/2014  . Health maintenance examination 09/15/2014  . Local skin infection 12/17/2013  . Ankle edema 12/17/2013  . Type II or unspecified type diabetes mellitus without mention of complication, uncontrolled 06/16/2013  . Preventative health care 06/16/2013  .  Hypertriglyceridemia 02/09/2013  . Chronic venous insufficiency 01/12/2013  . Prostate cancer screening 11/03/2012  . Colon cancer screening 11/03/2012  . HTN (hypertension) 09/28/2012  . Diabetes mellitus with complication ([place]) [id number]    Lyndee Hensen, PT, DPT 9:36 AM  11/03/18    [facility] [place], [place], [address] Phone: [phone]   Fax:  [phone]  Name: Brett Cruz MRN: [id number] Date of Birth: 10-17-1960

## 2018-11-05 DIAGNOSIS — Z86010 Personal history of colonic polyps: Secondary | ICD-10-CM

## 2018-11-05 DIAGNOSIS — Z860101 Personal history of adenomatous and serrated colon polyps: Secondary | ICD-10-CM

## 2018-11-05 HISTORY — DX: Personal history of colonic polyps: Z86.010

## 2018-11-05 HISTORY — DX: Personal history of adenomatous and serrated colon polyps: Z86.0101

## 2018-11-08 ENCOUNTER — Encounter: Payer: Self-pay | Admitting: Family Medicine

## 2018-11-10 ENCOUNTER — Ambulatory Visit (INDEPENDENT_AMBULATORY_CARE_PROVIDER_SITE_OTHER): Payer: Federal, State, Local not specified - PPO | Admitting: Physical Therapy

## 2018-11-10 ENCOUNTER — Encounter: Payer: Self-pay | Admitting: Physical Therapy

## 2018-11-10 ENCOUNTER — Other Ambulatory Visit: Payer: Self-pay

## 2018-11-10 DIAGNOSIS — M6281 Muscle weakness (generalized): Secondary | ICD-10-CM | POA: Diagnosis not present

## 2018-11-10 DIAGNOSIS — M25511 Pain in right shoulder: Secondary | ICD-10-CM | POA: Diagnosis not present

## 2018-11-10 DIAGNOSIS — G8929 Other chronic pain: Secondary | ICD-10-CM

## 2018-11-10 DIAGNOSIS — M5442 Lumbago with sciatica, left side: Secondary | ICD-10-CM | POA: Diagnosis not present

## 2018-11-10 DIAGNOSIS — M25611 Stiffness of right shoulder, not elsewhere classified: Secondary | ICD-10-CM

## 2018-11-12 ENCOUNTER — Other Ambulatory Visit: Payer: Self-pay

## 2018-11-12 ENCOUNTER — Encounter: Payer: Self-pay | Admitting: Physical Therapy

## 2018-11-12 ENCOUNTER — Encounter: Payer: Federal, State, Local not specified - PPO | Admitting: Physical Therapy

## 2018-11-12 ENCOUNTER — Ambulatory Visit: Payer: Federal, State, Local not specified - PPO | Admitting: Physical Therapy

## 2018-11-12 DIAGNOSIS — M6281 Muscle weakness (generalized): Secondary | ICD-10-CM

## 2018-11-12 DIAGNOSIS — M5442 Lumbago with sciatica, left side: Secondary | ICD-10-CM

## 2018-11-12 DIAGNOSIS — M25611 Stiffness of right shoulder, not elsewhere classified: Secondary | ICD-10-CM

## 2018-11-12 DIAGNOSIS — M25511 Pain in right shoulder: Secondary | ICD-10-CM

## 2018-11-12 DIAGNOSIS — G8929 Other chronic pain: Secondary | ICD-10-CM

## 2018-11-12 NOTE — Therapy (Signed)
[facility] [address] [place], [place], [address] Phone: [phone]   Fax:  [phone]  Physical Therapy Treatment  Patient Details  Name: Brett Cruz MRN: [id number] Date of Birth: [birth year] Referring Provider (PT): Creig Hines   Encounter Date: 11/10/2018  PT End of Session - 11/12/18 0857    Visit Number  8    Number of Visits  12    Date for PT Re-Evaluation  11/12/18    Authorization Type  BCBS    PT Start Time  1603    PT Stop Time  1645    PT Time Calculation (min)  42 min    Activity Tolerance  Patient tolerated treatment well    Behavior During Therapy  [facility] for tasks assessed/performed       Past Medical History:  Diagnosis Date  . Arthritis of right acromioclavicular joint    steroid inj 09/2018 (Dr. Tamala Julian)  . Cataract   . Chronic venous insufficiency   . Diverticulitis    Episodes very infrequent.  . Diverticulosis 2006?   Dr Lynnell Chad.  Episodes very infrequent.  . 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 Zigmund Daniel in [place].  Marland Kitchen GAD (generalized anxiety disorder)   . Glaucoma   . High triglycerides   . Hypertension approx 2010  . Hypertensive retinopathy of both eyes    Dr. Zigmund Daniel  . Mood disorder ([place])    dep/anx  . Obesity, Class I, BMI 30-34.9   . Right rotator cuff tendinitis    Inj 10/2018 Dr. Tamala Julian    Past Surgical History:  Procedure Laterality Date  . ANTERIOR CRUCIATE LIGAMENT REPAIR  1985  . COLONOSCOPY  ? 2009?   Pt unsure of timing of initial colonoscopy->diverticulosis but no polyps: plan for rpt as of 10/2018 assessment by Dr. Hilarie Fredrickson.  Marland Kitchen HERNIA REPAIR     age 50 and 58 years old  . [place] SURGERY  1999  . TONSILECTOMY, ADENOIDECTOMY, BILATERAL MYRINGOTOMY AND TUBES  1966    There were no vitals filed for this visit.  Subjective Assessment - 11/12/18 0856    Subjective  Pt states shoulder looser today, appt in PM  instead of AM.    Currently in Pain?  Yes    Pain Score  1     Pain Location  Shoulder    Pain Orientation  Right    Pain Descriptors / Indicators  Aching    Pain Type  Acute pain    Pain Onset  More than a month ago    Pain Frequency  Intermittent                       [facility] Adult PT Treatment/Exercise - 11/12/18 0001      Exercises   Exercises  Shoulder;Lumbar      Shoulder Exercises: Supine   Flexion  AAROM;20 reps    Flexion Limitations  cane 2.5 lb      Shoulder Exercises: Standing   Horizontal ABduction  20 reps    Theraband Level (Shoulder Horizontal ABduction)  Level 2 (Red)    Flexion  AROM;20 reps    Diagonals  20 reps    Theraband Level (Shoulder Diagonals)  Level 3 (Green)    Diagonals Limitations  D2      Shoulder Exercises: Pulleys   Flexion  2 minutes      Shoulder Exercises: ROM/Strengthening   UBE (Upper  Arm Bike)  90 RPE x 4 min (fwd/bwd)       Shoulder Exercises: Stretch   Internal Rotation Stretch  5 reps    Other Shoulder Stretches  ER butterfly x20       Manual Therapy   Manual Therapy  Joint mobilization;Passive ROM;Manual Traction    Joint Mobilization  GHJ mobs Inf and Post gr 3,     Passive ROM  R shoulder, all motions,  subscap release               PT Short Term Goals - 11/03/18 0928      PT SHORT TERM GOAL #1   Title  Pt to be independent with initial HEP for R shoulder and back    Time  2    Period  Weeks    Status  Achieved    Target Date  10/15/18      PT SHORT TERM GOAL #2   Title  Pt to report decreased pain in R shoulder, to 3/10 with activity    Time  2    Period  Weeks    Status  Achieved    Target Date  10/15/18        PT Long Term Goals - 10/03/18 1336      PT LONG TERM GOAL #1   Title  Pt to be independent with final HEP for R shoulder and back    Time  6    Period  Weeks    Status  New    Target Date  11/12/18      PT LONG TERM GOAL #2   Title  Pt to report decreased pain in R  shoulder to 0-2/10 with activity    Time  6    Period  Weeks    Status  New    Target Date  11/12/18      PT LONG TERM GOAL #3   Title  Pt to demo improved strength of R shoulder to at least 4+/5 to improve ability for reaching , lifting, and IADLS.      PT LONG TERM GOAL #4   Title  Pt to demo AROM of R shoulder to be WNL to improve ability for ADLs and IADLS.    Time  6    Period  Weeks    Status  New    Target Date  11/12/18      PT LONG TERM GOAL #5   Title  Pt to report decreased pain in low back and R LE to be 0-3/10 with standing activity.    Time  6    Period  Weeks    Status  New    Target Date  11/12/18            Plan - 11/12/18 0858    Clinical Impression Statement  Pt with improving PROM and AROM, as well as pain. He is limited with full ROM for elevation. Added Horizontal abd and D2 diagonals for strengthening today. Pt progressing well.    Examination-Activity Limitations  Locomotion Level;Reach Overhead;Bend;Carry;Squat;Stand;Lift    Examination-Participation Restrictions  Cleaning;Community Activity;Yard Work    Merchant [employer] officer  Evolving/Moderate complexity    Rehab Potential  Good    PT Frequency  2x / week    PT Duration  6 weeks    PT Treatment/Interventions  ADLs/Self Care Home Management;Cryotherapy;Electrical Stimulation;Ultrasound;Traction;Moist Heat;Iontophoresis 4mg /ml Dexamethasone;Gait training;Stair training;Functional mobility training;Therapeutic activities;Therapeutic exercise;Balance training;Neuromuscular re-education;Manual techniques;Patient/family education;Passive range of motion;Dry needling;Spinal Manipulations;Joint Manipulations;Taping  Consulted and Agree with Plan of Care  Patient       Patient will benefit from skilled therapeutic intervention in order to improve the following deficits and impairments:  Decreased range of motion, Impaired UE functional use, Increased muscle spasms, Decreased endurance,  Decreased activity tolerance, Pain, Improper body mechanics, Impaired flexibility, Hypomobility, Decreased mobility, Decreased strength  Visit Diagnosis: Acute pain of right shoulder  Stiffness of right shoulder, not elsewhere classified  Muscle weakness (generalized)  Chronic bilateral low back pain with left-sided sciatica     Problem List Patient Active Problem List   Diagnosis Date Noted  . Right shoulder tendinitis 09/09/2018  . AC (acromioclavicular) arthritis 09/09/2018  . GAD (generalized anxiety disorder) 05/07/2017  . Wheeze 09/23/2014  . Maxillary sinusitis 09/23/2014  . Acute bronchitis 09/23/2014  . Cough 09/23/2014  . Health maintenance examination 09/15/2014  . Local skin infection 12/17/2013  . Ankle edema 12/17/2013  . Type II or unspecified type diabetes mellitus without mention of complication, uncontrolled 06/16/2013  . Preventative health care 06/16/2013  . Hypertriglyceridemia 02/09/2013  . Chronic venous insufficiency 01/12/2013  . Prostate cancer screening 11/03/2012  . Colon cancer screening 11/03/2012  . HTN (hypertension) 09/28/2012  . Diabetes mellitus with complication ([place]) [id number]    Lyndee Hensen, PT, DPT 8:59 AM  11/12/18    [facility] [place], [place], [address] Phone: [phone]   Fax:  [phone]  Name: Trentin Sanda MRN: [id number] Date of Birth: [birth year]

## 2018-11-12 NOTE — Therapy (Signed)
[facility] [address] [place], [place], [address] Phone: [phone]   Fax:  [phone]  Physical Therapy Treatment/Re-Cert   Patient Details  Name: Brett Cruz MRN: [id number] Date of Birth: 1960-03-11 Referring Provider (PT): Creig Hines   Encounter Date: 11/12/2018  PT End of Session - 11/12/18 0902    Visit Number  9    Number of Visits  20    Date for PT Re-Evaluation  12/10/18    Authorization Type  BCBS    PT Start Time  0802    PT Stop Time  0844    PT Time Calculation (min)  42 min    Activity Tolerance  Patient tolerated treatment well    Behavior During Therapy  [facility] for tasks assessed/performed       Past Medical History:  Diagnosis Date  . Arthritis of right acromioclavicular joint    steroid inj 09/2018 (Dr. Tamala Julian)  . Cataract   . Chronic venous insufficiency   . Diverticulitis    Episodes very infrequent.  . Diverticulosis 2006?   Dr Lynnell Chad.  Episodes very infrequent.  . 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 Zigmund Daniel in [place].  Marland Kitchen GAD (generalized anxiety disorder)   . Glaucoma   . High triglycerides   . Hypertension approx 2010  . Hypertensive retinopathy of both eyes    Dr. Zigmund Daniel  . Mood disorder ([place])    dep/anx  . Obesity, Class I, BMI 30-34.9   . Right rotator cuff tendinitis    Inj 10/2018 Dr. Tamala Julian    Past Surgical History:  Procedure Laterality Date  . ANTERIOR CRUCIATE LIGAMENT REPAIR  1985  . COLONOSCOPY  ? 2009?   Pt unsure of timing of initial colonoscopy->diverticulosis but no polyps: plan for rpt as of 10/2018 assessment by Dr. Hilarie Fredrickson.  Marland Kitchen HERNIA REPAIR     age 62 and 58 years old  . [place] SURGERY  1999  . TONSILECTOMY, ADENOIDECTOMY, BILATERAL MYRINGOTOMY AND TUBES  1966    There were no vitals filed for this visit.  Subjective Assessment - 11/12/18 0900    Subjective  Pt states overall less pain in  shoulder. He notes difficulty with reaching out and up. States back only bothers him once in a while when he is standing for long periods of time, cooking. Next F/U with Dr is 11/11.    Limitations  Lifting;Standing;House hold activities    Patient Stated Goals  decreased pain in R shoulder, back, and improved ability for exercise    Currently in Pain?  Yes    Pain Score  1     Pain Location  Shoulder    Pain Orientation  Right    Pain Type  Acute pain    Pain Onset  More than a month ago    Pain Frequency  Intermittent    Aggravating Factors   reaching    Pain Score  3    Pain Location  Back    Pain Orientation  Right;Left    Pain Descriptors / Indicators  Aching    Pain Type  Chronic pain    Pain Onset  More than a month ago    Pain Frequency  Intermittent    Aggravating Factors   standing , Pain only once in a while         [facility] PT Assessment - 11/12/18 0906      AROM  Right Shoulder Flexion  145 Degrees    Right Shoulder ABduction  145 Degrees    Right Shoulder Internal Rotation  --   wnl   Right Shoulder External Rotation  65 Degrees   L: 75     PROM   Right Shoulder Flexion  155 Degrees    Right Shoulder ABduction  150 Degrees    Right Shoulder Internal Rotation  --   WNL/Neutral:  Behind back: to T11/12   Right Shoulder External Rotation  65 Degrees   65 at neutral,  80 deg at 90/90     Strength   Right Shoulder Flexion  4/5   taken at 90 deg   Right Shoulder ABduction  4/5   taken at 90 deg    Right Shoulder Internal Rotation  4+/5    Right Shoulder External Rotation  4+/5                   [facility] Adult PT Treatment/Exercise - 11/12/18 0912      Exercises   Exercises  Shoulder;Lumbar      Shoulder Exercises: Supine   Horizontal ABduction  20 reps    Horizontal ABduction Weight (lbs)  3    Flexion  AAROM;20 reps    Flexion Limitations  cane 2.5 lb      Shoulder Exercises: Sidelying   External Rotation  20 reps    External Rotation  Weight (lbs)  3      Shoulder Exercises: Standing   Horizontal ABduction  20 reps    Theraband Level (Shoulder Horizontal ABduction)  Level 2 (Red)    External Rotation  20 reps    Theraband Level (Shoulder External Rotation)  Level 3 (Green)    Internal Rotation  20 reps    Theraband Level (Shoulder Internal Rotation)  Level 3 (Green)    Flexion  AROM;10 reps    Shoulder Flexion Weight (lbs)  2    Diagonals  20 reps    Theraband Level (Shoulder Diagonals)  Level 3 (Green)    Diagonals Limitations  D2      Shoulder Exercises: Pulleys   Flexion  2 minutes    Scaption  1 minute      Shoulder Exercises: ROM/Strengthening   UBE (Upper Arm Bike)  90 RPE x 4 min (fwd/bwd)       Shoulder Exercises: Stretch   Internal Rotation Stretch  --    Internal Rotation Stretch Limitations  10 reps ext first, then IR behing back with stick x15    Other Shoulder Stretches  --      Manual Therapy   Manual Therapy  Joint mobilization;Passive ROM;Manual Traction    Joint Mobilization  GHJ mobs Inf and Post gr 3,     Passive ROM  R shoulder, all motions,  subscap release             PT Education - 11/12/18 0902    Education Details  HEP reviewed, handout given previously    Person(s) Educated  Patient    Methods  Explanation;Demonstration    Comprehension  Verbalized understanding;Returned demonstration       PT Short Term Goals - 11/03/18 0928      PT SHORT TERM GOAL #1   Title  Pt to be independent with initial HEP for R shoulder and back    Time  2    Period  Weeks    Status  Achieved    Target Date  10/15/18  PT SHORT TERM GOAL #2   Title  Pt to report decreased pain in R shoulder, to 3/10 with activity    Time  2    Period  Weeks    Status  Achieved    Target Date  10/15/18        PT Long Term Goals - 11/12/18 0903      PT LONG TERM GOAL #1   Title  Pt to be independent with final HEP for R shoulder and back    Time  6    Period  Weeks    Status  Partially  Met      PT LONG TERM GOAL #2   Title  Pt to report decreased pain in R shoulder to 0-2/10 with activity    Time  6    Period  Weeks    Status  Partially Met      PT LONG TERM GOAL #3   Title  Pt to demo improved strength of R shoulder to at least 4+/5 to improve ability for reaching , lifting, and IADLS.    Status  Partially Met      PT LONG TERM GOAL #4   Title  Pt to demo AROM of R shoulder to be WNL to improve ability for ADLs and IADLS.    Time  6    Period  Weeks    Status  On-going      PT LONG TERM GOAL #5   Title  Pt to report decreased pain in low back and R LE to be 0-3/10 with standing activity.    Time  6    Period  Weeks    Status  Partially Met            Plan - 11/12/18 0903    Clinical Impression Statement  Pt showing progress with PROM and AROM measurements. Progress has been slow, to regain full elevation, but is improving. Injection has helped pain. Pt with improving strength in all directions. Most limitation is still for full elevation. Rotation motions much improved. Behind the back IR still sore and mildy limited. Pt with good understanding of HEP for back, states back only bothering him "once in a while". Main focus will continue to be shoulder ROM. Pt will require continuation of PT for a couple more weeks, to ensure max/optimal outcome for shoulder flexion ROM. Plan to work towards d/c.    Examination-Activity Limitations  Locomotion Level;Reach Overhead;Bend;Carry;Squat;Stand;Lift    Examination-Participation Restrictions  Cleaning;Community Activity;Yard Work    [employer] officer  Evolving/Moderate complexity    Rehab Potential  Good    PT Frequency  2x / week    PT Duration  6 weeks    PT Treatment/Interventions  ADLs/Self Care Home Management;Cryotherapy;Electrical Stimulation;Ultrasound;Traction;Moist Heat;Iontophoresis 72m/ml Dexamethasone;Gait training;Stair training;Functional mobility training;Therapeutic  activities;Therapeutic exercise;Balance training;Neuromuscular re-education;Manual techniques;Patient/family education;Passive range of motion;Dry needling;Spinal Manipulations;Joint Manipulations;Taping    Consulted and Agree with Plan of Care  Patient       Patient will benefit from skilled therapeutic intervention in order to improve the following deficits and impairments:  Decreased range of motion, Impaired UE functional use, Increased muscle spasms, Decreased endurance, Decreased activity tolerance, Pain, Improper body mechanics, Impaired flexibility, Hypomobility, Decreased mobility, Decreased strength  Visit Diagnosis: Acute pain of right shoulder  Stiffness of right shoulder, not elsewhere classified  Muscle weakness (generalized)  Chronic bilateral low back pain with left-sided sciatica     Problem List Patient Active Problem List   Diagnosis Date  Noted  . Right shoulder tendinitis 09/09/2018  . AC (acromioclavicular) arthritis 09/09/2018  . GAD (generalized anxiety disorder) 05/07/2017  . Wheeze 09/23/2014  . Maxillary sinusitis 09/23/2014  . Acute bronchitis 09/23/2014  . Cough 09/23/2014  . Health maintenance examination 09/15/2014  . Local skin infection 12/17/2013  . Ankle edema 12/17/2013  . Type II or unspecified type diabetes mellitus without mention of complication, uncontrolled 06/16/2013  . Preventative health care 06/16/2013  . Hypertriglyceridemia 02/09/2013  . Chronic venous insufficiency 01/12/2013  . Prostate cancer screening 11/03/2012  . Colon cancer screening 11/03/2012  . HTN (hypertension) 09/28/2012  . Diabetes mellitus with complication ([place]) 91/[id number]/4503    Lyndee Hensen, PT, DPT 9:14 AM  11/12/18    [facility] [place], [place], [address] Phone: [phone]   Fax:  [phone]  Name: Brett Cruz MRN: [id number] Date of Birth: 06/16/1960

## 2018-11-17 ENCOUNTER — Other Ambulatory Visit: Payer: Self-pay

## 2018-11-17 ENCOUNTER — Encounter: Payer: Self-pay | Admitting: Physical Therapy

## 2018-11-17 ENCOUNTER — Ambulatory Visit: Payer: Federal, State, Local not specified - PPO | Admitting: Physical Therapy

## 2018-11-17 DIAGNOSIS — G8929 Other chronic pain: Secondary | ICD-10-CM

## 2018-11-17 DIAGNOSIS — M25511 Pain in right shoulder: Secondary | ICD-10-CM

## 2018-11-17 DIAGNOSIS — M5442 Lumbago with sciatica, left side: Secondary | ICD-10-CM | POA: Diagnosis not present

## 2018-11-17 DIAGNOSIS — M6281 Muscle weakness (generalized): Secondary | ICD-10-CM | POA: Diagnosis not present

## 2018-11-17 DIAGNOSIS — M25611 Stiffness of right shoulder, not elsewhere classified: Secondary | ICD-10-CM

## 2018-11-17 NOTE — Therapy (Signed)
[facility] [address] [place], [place], [address] Phone: [phone]   Fax:  [phone]  Physical Therapy Treatment  Patient Details  Name: Brett Cruz MRN: [id number] Date of Birth: [birth year] Referring Provider (PT): Creig Hines   Encounter Date: 11/17/2018  PT End of Session - 11/17/18 1256    Visit Number  10    Number of Visits  20    Date for PT Re-Evaluation  12/10/18    Authorization Type  BCBS    PT Start Time  0804    PT Stop Time  0844    PT Time Calculation (min)  40 min    Activity Tolerance  Patient tolerated treatment well    Behavior During Therapy  [facility] for tasks assessed/performed       Past Medical History:  Diagnosis Date  . Arthritis of right acromioclavicular joint    steroid inj 09/2018 (Dr. Tamala Julian)  . Cataract   . Chronic venous insufficiency   . Diverticulitis    Episodes very infrequent.  . Diverticulosis 2006?   Dr Lynnell Chad.  Episodes very infrequent.  . 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 Zigmund Daniel in [place].  Marland Kitchen GAD (generalized anxiety disorder)   . Glaucoma   . High triglycerides   . Hypertension approx 2010  . Hypertensive retinopathy of both eyes    Dr. Zigmund Daniel  . Mood disorder ([place])    dep/anx  . Obesity, Class I, BMI 30-34.9   . Right rotator cuff tendinitis    Inj 10/2018 Dr. Tamala Julian    Past Surgical History:  Procedure Laterality Date  . ANTERIOR CRUCIATE LIGAMENT REPAIR  1985  . COLONOSCOPY  ? 2009?   Pt unsure of timing of initial colonoscopy->diverticulosis but no polyps: plan for rpt as of 10/2018 assessment by Dr. Hilarie Fredrickson.  Marland Kitchen HERNIA REPAIR     age 6 and 58 years old  . [place] SURGERY  1999  . TONSILECTOMY, ADENOIDECTOMY, BILATERAL MYRINGOTOMY AND TUBES  1966    There were no vitals filed for this visit.  Subjective Assessment - 11/17/18 1256    Subjective  Pt states improving motion for all  directions, as well as behind the back    Currently in Pain?  Yes    Pain Score  1     Pain Location  Shoulder    Pain Orientation  Right    Pain Descriptors / Indicators  Aching    Pain Type  Acute pain    Pain Onset  More than a month ago    Pain Frequency  Intermittent                       [facility] Adult PT Treatment/Exercise - 11/17/18 0810      Exercises   Exercises  Shoulder;Lumbar      Shoulder Exercises: Supine   Horizontal ABduction  20 reps    Horizontal ABduction Weight (lbs)  3    Flexion  AAROM;20 reps    Flexion Limitations  cane 2.5 lb      Shoulder Exercises: Sidelying   External Rotation  --    External Rotation Weight (lbs)  --    Other Sidelying Exercises  wall push ups x20;       Shoulder Exercises: Standing   Horizontal ABduction  20 reps    Theraband Level (Shoulder Horizontal ABduction)  Level 2 (Red)    External  Rotation  --    Theraband Level (Shoulder External Rotation)  --    Internal Rotation  --    Theraband Level (Shoulder Internal Rotation)  --    Flexion  AROM;10 reps    Shoulder Flexion Weight (lbs)  2    ABduction  AROM;20 reps    ABduction Limitations  2lb to 90 deg    Diagonals  20 reps    Theraband Level (Shoulder Diagonals)  Level 3 (Green)    Diagonals Limitations  D2      Shoulder Exercises: Pulleys   Flexion  --    Scaption  --      Shoulder Exercises: ROM/Strengthening   UBE (Upper Arm Bike)  90 RPE x 5 min (fwd/bwd)       Shoulder Exercises: Stretch   Internal Rotation Stretch Limitations  10 reps ext first, then IR behing back with stick x20      Manual Therapy   Manual Therapy  Joint mobilization;Passive ROM;Manual Traction    Joint Mobilization  GHJ mobs Inf and Post gr 3,     Passive ROM  R shoulder, all motions,                 PT Short Term Goals - 11/03/18 0928      PT SHORT TERM GOAL #1   Title  Pt to be independent with initial HEP for R shoulder and back    Time  2    Period   Weeks    Status  Achieved    Target Date  10/15/18      PT SHORT TERM GOAL #2   Title  Pt to report decreased pain in R shoulder, to 3/10 with activity    Time  2    Period  Weeks    Status  Achieved    Target Date  10/15/18        PT Long Term Goals - 11/12/18 0903      PT LONG TERM GOAL #1   Title  Pt to be independent with final HEP for R shoulder and back    Time  6    Period  Weeks    Status  Partially Met      PT LONG TERM GOAL #2   Title  Pt to report decreased pain in R shoulder to 0-2/10 with activity    Time  6    Period  Weeks    Status  Partially Met      PT LONG TERM GOAL #3   Title  Pt to demo improved strength of R shoulder to at least 4+/5 to improve ability for reaching , lifting, and IADLS.    Status  Partially Met      PT LONG TERM GOAL #4   Title  Pt to demo AROM of R shoulder to be WNL to improve ability for ADLs and IADLS.    Time  6    Period  Weeks    Status  On-going      PT LONG TERM GOAL #5   Title  Pt to report decreased pain in low back and R LE to be 0-3/10 with standing activity.    Time  6    Period  Weeks    Status  Partially Met            Plan - 11/17/18 1257    Clinical Impression Statement  Pt with improving motion for flexion, IR, and horizontal add. Flexion still  mildly limited compared to L side. Pain much improved with all motions. Pt improving with strength as well, has been able to progress ther ex. Plan to see pt for 1-2 more weeks, and work towards d/c. Will focus on gaining full flexion ROM.    Examination-Activity Limitations  Locomotion Level;Reach Overhead;Bend;Carry;Squat;Stand;Lift    Examination-Participation Restrictions  Cleaning;Community Activity;Yard Work    [employer] officer  Evolving/Moderate complexity    Rehab Potential  Good    PT Frequency  2x / week    PT Duration  6 weeks    PT Treatment/Interventions  ADLs/Self Care Home Management;Cryotherapy;Electrical  Stimulation;Ultrasound;Traction;Moist Heat;Iontophoresis 51m/ml Dexamethasone;Gait training;Stair training;Functional mobility training;Therapeutic activities;Therapeutic exercise;Balance training;Neuromuscular re-education;Manual techniques;Patient/family education;Passive range of motion;Dry needling;Spinal Manipulations;Joint Manipulations;Taping    Consulted and Agree with Plan of Care  Patient       Patient will benefit from skilled therapeutic intervention in order to improve the following deficits and impairments:  Decreased range of motion, Impaired UE functional use, Increased muscle spasms, Decreased endurance, Decreased activity tolerance, Pain, Improper body mechanics, Impaired flexibility, Hypomobility, Decreased mobility, Decreased strength  Visit Diagnosis: Acute pain of right shoulder  Stiffness of right shoulder, not elsewhere classified  Muscle weakness (generalized)  Chronic bilateral low back pain with left-sided sciatica     Problem List Patient Active Problem List   Diagnosis Date Noted  . Right shoulder tendinitis 09/09/2018  . AC (acromioclavicular) arthritis 09/09/2018  . GAD (generalized anxiety disorder) 05/07/2017  . Wheeze 09/23/2014  . Maxillary sinusitis 09/23/2014  . Acute bronchitis 09/23/2014  . Cough 09/23/2014  . Health maintenance examination 09/15/2014  . Local skin infection 12/17/2013  . Ankle edema 12/17/2013  . Type II or unspecified type diabetes mellitus without mention of complication, uncontrolled 06/16/2013  . Preventative health care 06/16/2013  . Hypertriglyceridemia 02/09/2013  . Chronic venous insufficiency 01/12/2013  . Prostate cancer screening 11/03/2012  . Colon cancer screening 11/03/2012  . HTN (hypertension) 09/28/2012  . Diabetes mellitus with complication ([place] 082/50/0370   LLyndee Hensen PT, DPT 12:59 PM  11/17/18    [place]  [address]Phone: [phone]  Fax:  [phone] Name: Brett BretadoMRN: [id number] of Birth: 1Sep 28, 1962

## 2018-11-18 ENCOUNTER — Encounter (INDEPENDENT_AMBULATORY_CARE_PROVIDER_SITE_OTHER): Payer: Federal, State, Local not specified - PPO | Admitting: Ophthalmology

## 2018-11-18 DIAGNOSIS — E11319 Type 2 diabetes mellitus with unspecified diabetic retinopathy without macular edema: Secondary | ICD-10-CM

## 2018-11-18 DIAGNOSIS — E113293 Type 2 diabetes mellitus with mild nonproliferative diabetic retinopathy without macular edema, bilateral: Secondary | ICD-10-CM

## 2018-11-18 DIAGNOSIS — I10 Essential (primary) hypertension: Secondary | ICD-10-CM | POA: Diagnosis not present

## 2018-11-18 DIAGNOSIS — H35033 Hypertensive retinopathy, bilateral: Secondary | ICD-10-CM

## 2018-11-18 DIAGNOSIS — H33302 Unspecified retinal break, left eye: Secondary | ICD-10-CM

## 2018-11-18 DIAGNOSIS — H43813 Vitreous degeneration, bilateral: Secondary | ICD-10-CM

## 2018-11-18 LAB — HM DIABETES EYE EXAM

## 2018-11-19 ENCOUNTER — Other Ambulatory Visit: Payer: Self-pay

## 2018-11-19 ENCOUNTER — Ambulatory Visit: Payer: Federal, State, Local not specified - PPO | Admitting: Physical Therapy

## 2018-11-19 ENCOUNTER — Encounter: Payer: Self-pay | Admitting: Physical Therapy

## 2018-11-19 DIAGNOSIS — M5442 Lumbago with sciatica, left side: Secondary | ICD-10-CM | POA: Diagnosis not present

## 2018-11-19 DIAGNOSIS — M6281 Muscle weakness (generalized): Secondary | ICD-10-CM

## 2018-11-19 DIAGNOSIS — M25511 Pain in right shoulder: Secondary | ICD-10-CM

## 2018-11-19 DIAGNOSIS — G8929 Other chronic pain: Secondary | ICD-10-CM

## 2018-11-19 DIAGNOSIS — M25611 Stiffness of right shoulder, not elsewhere classified: Secondary | ICD-10-CM | POA: Diagnosis not present

## 2018-11-19 NOTE — Therapy (Addendum)
[facility] [address] [place], [place], [address] Phone: [phone]   Fax:  [phone]  Physical Therapy Treatment  Patient Details  Name: Brett Cruz MRN: [id number] Date of Birth: 09-09-1960 Referring Provider (PT): Creig Hines   Encounter Date: 11/19/2018  PT End of Session - 11/19/18 0835    Visit Number  11    Number of Visits  20    Date for PT Re-Evaluation  12/10/18    Authorization Type  BCBS    PT Start Time  0805    PT Stop Time  0844    PT Time Calculation (min)  39 min    Activity Tolerance  Patient tolerated treatment well    Behavior During Therapy  [facility] for tasks assessed/performed       Past Medical History:  Diagnosis Date  . Arthritis of right acromioclavicular joint    steroid inj 09/2018 (Dr. Tamala Julian)  . Cataract   . Chronic venous insufficiency   . Diverticulitis    Episodes very infrequent.  . Diverticulosis 2006?   Dr Lynnell Chad.  Episodes very infrequent.  . 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 Zigmund Daniel in [place].  Marland Kitchen GAD (generalized anxiety disorder)   . Glaucoma   . High triglycerides   . Hypertension approx 2010  . Hypertensive retinopathy of both eyes    Dr. Zigmund Daniel  . Mood disorder ([place])    dep/anx  . Obesity, Class I, BMI 30-34.9   . Right rotator cuff tendinitis    Inj 10/2018 Dr. Tamala Julian    Past Surgical History:  Procedure Laterality Date  . ANTERIOR CRUCIATE LIGAMENT REPAIR  1985  . COLONOSCOPY  ? 2009?   Pt unsure of timing of initial colonoscopy->diverticulosis but no polyps: plan for rpt as of 10/2018 assessment by Dr. Hilarie Fredrickson.  Marland Kitchen HERNIA REPAIR     age 32 and 58 years old  . [place] SURGERY  1999  . TONSILECTOMY, ADENOIDECTOMY, BILATERAL MYRINGOTOMY AND TUBES  1966    There were no vitals filed for this visit.  Subjective Assessment - 11/19/18 2133    Subjective  Pt states minimal/no pain during the day. He  feels some stiffness, but feels shoulder is much better. Pt states that he will be unable to continue PT at this time, due to work schedule. He will follow up with DR at appt on 11/11. He is also doing HEP for back. Back only hurting with prolonged sitting or standing.    Currently in Pain?  No/denies    Pain Score  0-No pain         [facility] PT Assessment - 11/19/18 0001      AROM   Right Shoulder Flexion  145 Degrees    Right Shoulder ABduction  145 Degrees    Right Shoulder Internal Rotation  --   wnl   Right Shoulder External Rotation  65 Degrees      PROM   Right Shoulder Flexion  160 Degrees    Right Shoulder ABduction  150 Degrees    Right Shoulder Internal Rotation  70 Degrees    Right Shoulder External Rotation  65 Degrees      Strength   Right Shoulder Flexion  4/5    Right Shoulder ABduction  4/5    Right Shoulder Internal Rotation  5/5    Right Shoulder External Rotation  5/5  [facility] Adult PT Treatment/Exercise - 11/19/18 0815      Exercises   Exercises  Shoulder;Lumbar      Shoulder Exercises: Supine   Horizontal ABduction  --    Horizontal ABduction Weight (lbs)  --    Flexion  AAROM;20 reps    Flexion Limitations  cane 2.5 lb      Shoulder Exercises: Sidelying   Other Sidelying Exercises  --      Shoulder Exercises: Standing   Horizontal ABduction  20 reps    Theraband Level (Shoulder Horizontal ABduction)  Level 2 (Red)    External Rotation  20 reps    Theraband Level (Shoulder External Rotation)  Level 3 (Green)    Internal Rotation  20 reps    Theraband Level (Shoulder Internal Rotation)  Level 3 (Green)    Flexion  AROM;20 reps    Shoulder Flexion Weight (lbs)  2    ABduction  AROM;20 reps    ABduction Limitations  2lb to 90 deg    Row  20 reps    Theraband Level (Shoulder Row)  Level 4 (Blue)    Diagonals  --    Theraband Level (Shoulder Diagonals)  --    Diagonals Limitations  --    Other Standing Exercises  wall  push ups x20;       Shoulder Exercises: ROM/Strengthening   UBE (Upper Arm Bike)  90 RPE x 5 min (fwd/bwd)       Shoulder Exercises: Stretch   Internal Rotation Stretch Limitations  10 reps ext first, then IR behing back with stick x20      Manual Therapy   Manual Therapy  Joint mobilization;Passive ROM;Manual Traction    Joint Mobilization  GHJ mobs Inf and Post gr 3,     Passive ROM  R shoulder, all motions,               PT Education - 11/19/18 2136    Education Details  Final HEp reviewed today    Person(s) Educated  Patient    Methods  Explanation;Demonstration    Comprehension  Verbalized understanding;Returned demonstration       PT Short Term Goals - 11/03/18 0928      PT SHORT TERM GOAL #1   Title  Pt to be independent with initial HEP for R shoulder and back    Time  2    Period  Weeks    Status  Achieved    Target Date  10/15/18      PT SHORT TERM GOAL #2   Title  Pt to report decreased pain in R shoulder, to 3/10 with activity    Time  2    Period  Weeks    Status  Achieved    Target Date  10/15/18        PT Long Term Goals - 11/19/18 2136      PT LONG TERM GOAL #1   Title  Pt to be independent with final HEP for R shoulder and back    Time  6    Period  Weeks    Status  Achieved      PT LONG TERM GOAL #2   Title  Pt to report decreased pain in R shoulder to 0-2/10 with activity    Time  6    Period  Weeks    Status  Achieved      PT LONG TERM GOAL #3   Title  Pt to demo improved strength of  R shoulder to at least 4+/5 to improve ability for reaching , lifting, and IADLS.    Status  Partially Met      PT LONG TERM GOAL #4   Title  Pt to demo AROM of R shoulder to be WNL to improve ability for ADLs and IADLS.    Time  6    Period  Weeks    Status  Partially Met      PT LONG TERM GOAL #5   Title  Pt to report decreased pain in low back and R LE to be 0-3/10 with standing activity.    Time  6    Period  Weeks    Status  Achieved             Plan - 11/19/18 2137    Clinical Impression Statement  Pt has made good progress. He has met most goals. He is doing well with HEP for back and shoulder. He does have mild stiffness at end range for flexion, and has lack of full/end range AROM. But pt with much improved rotation, IR behind back, as well as reaching, and carrying. Pt requests to hold PT at this time, due to busy work schedule. He will follow up with Dr at next appt on 11/11. Will d/c at this time.    Examination-Activity Limitations  Locomotion Level;Reach Overhead;Bend;Carry;Squat;Stand;Lift    Examination-Participation Restrictions  Cleaning;Community Activity;Yard Work    [employer]  Evolving/Moderate complexity    Rehab Potential  Good    PT Frequency  2x / week    PT Duration  6 weeks    PT Treatment/Interventions  ADLs/Self Care Home Management;Cryotherapy;Electrical Stimulation;Ultrasound;Traction;Moist Heat;Iontophoresis 21m/ml Dexamethasone;Gait training;Stair training;Functional mobility training;Therapeutic activities;Therapeutic exercise;Balance training;Neuromuscular re-education;Manual techniques;Patient/family education;Passive range of motion;Dry needling;Spinal Manipulations;Joint Manipulations;Taping    Consulted and Agree with Plan of Care  Patient       Patient will benefit from skilled therapeutic intervention in order to improve the following deficits and impairments:  Decreased range of motion, Impaired UE functional use, Increased muscle spasms, Decreased endurance, Decreased activity tolerance, Pain, Improper body mechanics, Impaired flexibility, Hypomobility, Decreased mobility, Decreased strength  Visit Diagnosis: Acute pain of right shoulder  Stiffness of right shoulder, not elsewhere classified  Muscle weakness (generalized)  Chronic bilateral low back pain with left-sided sciatica     Problem List Patient Active Problem List   Diagnosis Date Noted  .  Right shoulder tendinitis 09/09/2018  . AC (acromioclavicular) arthritis 09/09/2018  . GAD (generalized anxiety disorder) 05/07/2017  . Wheeze 09/23/2014  . Maxillary sinusitis 09/23/2014  . Acute bronchitis 09/23/2014  . Cough 09/23/2014  . Health maintenance examination 09/15/2014  . Local skin infection 12/17/2013  . Ankle edema 12/17/2013  . Type II or unspecified type diabetes mellitus without mention of complication, uncontrolled 06/16/2013  . Preventative health care 06/16/2013  . Hypertriglyceridemia 02/09/2013  . Chronic venous insufficiency 01/12/2013  . Prostate cancer screening 11/03/2012  . Colon cancer screening 11/03/2012  . HTN (hypertension) 09/28/2012  . Diabetes mellitus with complication ([place] [id number]  LLyndee Hensen PT, DPT 9:40 PM  11/19/18    [place] [place] [address]Phone: [phone]  Fax:  [phone] Name: Brett LippmanMRN: [id number] of Birth: 128-Apr-1962  PHYSICAL THERAPY DISCHARGE SUMMARY  Visits from Start of Care: 11  Plan: Patient agrees to discharge.  Patient goals were met. Patient is being discharged due to meeting the stated rehab  goals.  ?????    Lyndee Hensen, PT, DPT 8:27 AM  02/23/19

## 2018-11-30 ENCOUNTER — Other Ambulatory Visit: Payer: Self-pay

## 2018-11-30 ENCOUNTER — Encounter: Payer: Self-pay | Admitting: Internal Medicine

## 2018-11-30 ENCOUNTER — Ambulatory Visit (AMBULATORY_SURGERY_CENTER): Payer: Federal, State, Local not specified - PPO | Admitting: Internal Medicine

## 2018-11-30 VITALS — BP 125/77 | HR 59 | Temp 98.2°F | Resp 18 | Ht 79.0 in | Wt 290.0 lb

## 2018-11-30 DIAGNOSIS — D124 Benign neoplasm of descending colon: Secondary | ICD-10-CM | POA: Diagnosis not present

## 2018-11-30 DIAGNOSIS — Z1211 Encounter for screening for malignant neoplasm of colon: Secondary | ICD-10-CM

## 2018-11-30 DIAGNOSIS — D122 Benign neoplasm of ascending colon: Secondary | ICD-10-CM

## 2018-11-30 DIAGNOSIS — D125 Benign neoplasm of sigmoid colon: Secondary | ICD-10-CM

## 2018-11-30 MED ORDER — SODIUM CHLORIDE 0.9 % IV SOLN: 500.0000 mL | Freq: Once | INTRAVENOUS | Status: DC

## 2018-11-30 NOTE — Progress Notes (Signed)
Report to PACU, RN, vss, BBS= Clear.  

## 2018-11-30 NOTE — Patient Instructions (Signed)
HANDOUTS PROVIDED ON: POLYPS, DIVERTICULOSIS, & HEMORRHOIDS  THE POLYPS REMOVED TODAY HAVE BEEN SENT FOR PATHOLOGY.  THE RESULTS CAN TAKE 2-3 WEEKS TO RECEIVE.  THE TIMEFRAME BEFORE YOUR NEXT COLONOSCOPY WILL BE DETERMINED BASED ON THE PATHOLOGY RESULTS.    YOU MAY RESUME YOUR PREVIOUS DIET AND MEDICATION SCHEDULE.  [place] YOU FOR ALLOWING [place] TO CARE FOR YOU TODAY!!  YOU HAD AN ENDOSCOPIC PROCEDURE TODAY AT [facility]:   Refer to the procedure report that was given to you for any specific questions about what was found during the examination.  If the procedure report does not answer your questions, please call your gastroenterologist to clarify.  If you requested that your care partner not be given the details of your procedure findings, then the procedure report has been included in a sealed envelope for you to review at your convenience later.  YOU SHOULD EXPECT: Some feelings of bloating in the abdomen. Passage of more gas than usual.  Walking can help get rid of the air that was put into your GI tract during the procedure and reduce the bloating. If you had a lower endoscopy (such as a colonoscopy or flexible sigmoidoscopy) you may notice spotting of blood in your stool or on the toilet paper. If you underwent a bowel prep for your procedure, you may not have a normal bowel movement for a few days.  Please Note:  You might notice some irritation and congestion in your nose or some drainage.  This is from the oxygen used during your procedure.  There is no need for concern and it should clear up in a day or so.  SYMPTOMS TO REPORT IMMEDIATELY:   Following lower endoscopy (colonoscopy or flexible sigmoidoscopy):  Excessive amounts of blood in the stool  Significant tenderness or worsening of abdominal pains  Swelling of the abdomen that is new, acute  Fever of 100F or higher  For urgent or emergent issues, a gastroenterologist can be reached at any hour by calling [phone]  [phone].   DIET:  We do recommend a small meal at first, but then you may proceed to your regular diet.  Drink plenty of fluids but you should avoid alcoholic beverages for 24 hours.  ACTIVITY:  You should plan to take it easy for the rest of today and you should NOT DRIVE or use heavy machinery until tomorrow (because of the sedation medicines used during the test).    FOLLOW UP: Our staff will call the number listed on your records 48-72 hours following your procedure to check on you and address any questions or concerns that you may have regarding the information given to you following your procedure. If we do not reach you, we will leave a message.  We will attempt to reach you two times.  During this call, we will ask if you have developed any symptoms of COVID 19. If you develop any symptoms (ie: fever, flu-like symptoms, shortness of breath, cough etc.) before then, please call [phone].  If you test positive for Covid 19 in the 2 weeks post procedure, please call and report this information to [place].    If any biopsies were taken you will be contacted by phone or by letter within the next 1-3 weeks.  Please call us at [phone] if you have not heard about the biopsies in 3 weeks.    SIGNATURES/CONFIDENTIALITY: You and/or your care partner have signed paperwork which will be entered into your electronic medical record.  These  signatures attest to the fact that that the information above on your After Visit Summary has been reviewed and is understood.  Full responsibility of the confidentiality of this discharge information lies with you and/or your care-partner. 

## 2018-11-30 NOTE — Op Note (Signed)
[facility] Patient Name: Brett Cruz Procedure Date: 11/30/2018 2:01 PM MRN: [id number] Endoscopist: Jerene Bears , MD Age: 58 Referring MD:  Date of Birth: September 23, 1960 Gender: Male Account #: [id number] Procedure:                Colonoscopy Indications:              Screening for colorectal malignant neoplasm, Last                            colonoscopy 10 years ago Medicines:                Monitored Anesthesia Care Procedure:                Pre-Anesthesia Assessment:                           - Prior to the procedure, a History and Physical                            was performed, and patient medications and                            allergies were reviewed. The patient's tolerance of                            previous anesthesia was also reviewed. The risks                            and benefits of the procedure and the sedation                            options and risks were discussed with the patient.                            All questions were answered, and informed consent                            was obtained. Prior Anticoagulants: The patient has                            taken no previous anticoagulant or antiplatelet                            agents. ASA Grade Assessment: II - A patient with                            mild systemic disease. After reviewing the risks                            and benefits, the patient was deemed in                            satisfactory condition to undergo the procedure.  After obtaining informed consent, the colonoscope                            was passed under direct vision. Throughout the                            procedure, the patient's blood pressure, pulse, and                            oxygen saturations were monitored continuously. The                            Colonoscope was introduced through the anus and                            advanced to the terminal ileum. The  colonoscopy was                            performed without difficulty. The patient tolerated                            the procedure well. The quality of the bowel                            preparation was good. Scope In: 2:07:59 PM Scope Out: 2:24:51 PM Scope Withdrawal Time: 0 hours 13 minutes 17 seconds  Total Procedure Duration: 0 hours 16 minutes 52 seconds  Findings:                 The digital rectal exam was normal.                           A 5 mm polyp was found in the ascending colon. The                            polyp was sessile. The polyp was removed with a                            cold snare. Resection and retrieval were complete.                           A 5 mm polyp was found in the descending colon. The                            polyp was sessile. The polyp was removed with a                            cold snare. Resection and retrieval were complete.                           A 4 mm polyp was found in the sigmoid colon. The  polyp was sessile. The polyp was removed with a                            cold snare. Resection and retrieval were complete.                           Multiple small and large-mouthed diverticula were                            found in the sigmoid colon and descending colon.                           Internal hemorrhoids were found during                            retroflexion. The hemorrhoids were small. Complications:            No immediate complications. Estimated Blood Loss:     Estimated blood loss was minimal. Impression:               - One 5 mm polyp in the ascending colon, removed                            with a cold snare. Resected and retrieved.                           - One 5 mm polyp in the descending colon, removed                            with a cold snare. Resected and retrieved.                           - One 4 mm polyp in the sigmoid colon, removed with                            a  cold snare. Resected and retrieved.                           - Moderate diverticulosis in the sigmoid colon and                            in the descending colon.                           - Small internal hemorrhoids. Recommendation:           - Patient has a contact number available for                            emergencies. The signs and symptoms of potential                            delayed complications were discussed with the  patient. Return to normal activities tomorrow.                            Written discharge instructions were provided to the                            patient.                           - Resume previous diet.                           - Continue present medications.                           - Await pathology results.                           - Repeat colonoscopy is recommended for                            surveillance. The colonoscopy date will be                            determined after pathology results from today's                            exam become available for review. Jerene Bears, MD 11/30/2018 2:28:08 PM This report has been signed electronically.

## 2018-11-30 NOTE — Progress Notes (Signed)
Pt's states no medical or surgical changes since previsit or office visit. 

## 2018-11-30 NOTE — Progress Notes (Signed)
Called to room to assist during endoscopic procedure.  Patient ID and intended procedure confirmed with present staff. Received instructions for my participation in the procedure from the performing physician.  

## 2018-12-02 ENCOUNTER — Telehealth: Payer: Self-pay

## 2018-12-02 NOTE — Telephone Encounter (Signed)
Second follow up call attempt, no answer, message left 

## 2018-12-02 NOTE — Telephone Encounter (Signed)
Follow up call attempted.  NALM  

## 2018-12-07 ENCOUNTER — Encounter: Payer: Self-pay | Admitting: Internal Medicine

## 2018-12-15 ENCOUNTER — Encounter: Payer: Self-pay | Admitting: Family Medicine

## 2018-12-15 ENCOUNTER — Telehealth: Payer: Self-pay | Admitting: Family Medicine

## 2018-12-15 MED ORDER — CIPROFLOXACIN HCL 500 MG PO TABS: 500.0000 mg | ORAL_TABLET | Freq: Two times a day (BID) | ORAL | 0 refills | Status: AC

## 2018-12-15 MED ORDER — METRONIDAZOLE 500 MG PO TABS: 500.0000 mg | ORAL_TABLET | Freq: Three times a day (TID) | ORAL | 0 refills | Status: AC

## 2018-12-15 NOTE — Telephone Encounter (Signed)
Patient reports he is having Diverticulitis symptoms. Patient request meds to be called into pharmacy to treat.  [facility] - [place]  Patient can be reached at [phone]

## 2018-12-15 NOTE — Telephone Encounter (Signed)
OK, I'll eRx abx, but needs at least a telemed visit tomorrow or the next day (in office preferred) to make sure things are ok and that his illness is still consistent with diverticulitis Make sure patient is aware he needs to avoid any alcohol intake while on metronidazole b/c it will make him vomit.-thx

## 2018-12-15 NOTE — Telephone Encounter (Signed)
Spoke with patient and he is having sharp pain on his left side, constipation and low grade fever that all started Sunday and last night worsened.  Please advise, thanks.

## 2018-12-16 ENCOUNTER — Encounter: Payer: Self-pay | Admitting: Family Medicine

## 2018-12-16 ENCOUNTER — Ambulatory Visit: Payer: Federal, State, Local not specified - PPO | Admitting: Family Medicine

## 2018-12-20 NOTE — Progress Notes (Signed)
[name] [place], [place] [address] Phone: [phone] Subjective:   I Brett Cruz am serving as a Education administrator for Dr. Hulan Saas.  I'm seeing this patient by the request  of:  McGowen, Adrian Blackwater, MD   CC: Shoulder pain  [id number]   10/28/2018 Patient given injection today, tolerated the procedure well.  Discussed icing regimen and home exercise, discussed which activities of doing which wants to avoid.  Increase activity as tolerated.  Patient is encouraged to continue with physical therapy.  Hoping that this injection helps the rest of the way.  Did respond fairly well to the acromioclavicular injection previously.  12/21/2018 Brett Cruz is a 58 y.o. male coming in with complaint of right shoulder pain. Patient states his shoulder is doing a lot better. Sore in some ROM.     Last seen October 28, 2018 and given injection of the right glenohumeral joint pain going to formal physical therapy  Past Medical History:  Diagnosis Date  . Arthritis of right acromioclavicular joint    steroid inj 09/2018 (Dr. Tamala Julian)  . Cataract   . Chronic venous insufficiency   . Diverticulosis 2009; 2020   Hx of 'itis.  . 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 Zigmund Daniel in [place].  Marland Kitchen GAD (generalized anxiety disorder)   . Glaucoma   . High triglycerides   . History of adenomatous polyp of colon 11/2018   Recall 11/2023.  Marland Kitchen Hypertension approx 2010  . Hypertensive retinopathy of both eyes    Dr. Zigmund Daniel  . Mood disorder ([place])    dep/anx  . Obesity, Class I, BMI 30-34.9   . Right rotator cuff tendinitis    Inj 10/2018 Dr. Tamala Julian   Past Surgical History:  Procedure Laterality Date  . ANTERIOR CRUCIATE LIGAMENT REPAIR  1985  . COLONOSCOPY  2009. 11/30/18   2009 normal.  11/2018 adenoma.  Recall 5 yrs. +Diverticulosis.  Marland Kitchen HERNIA REPAIR     age 23 and 58 years old  . [place]  SURGERY  1999  . TONSILECTOMY, ADENOIDECTOMY, BILATERAL MYRINGOTOMY AND TUBES  1966   Social History   Socioeconomic History  . Marital status: Married    Spouse name: Not on file  . Number of children: Not on file  . Years of education: Not on file  . Highest education level: Not on file  Occupational History  . Not on file  Social Needs  . Financial resource strain: Not on file  . Food insecurity    Worry: Not on file    Inability: Not on file  . Transportation needs    Medical: Not on file    Non-medical: Not on file  Tobacco Use  . Smoking status: Never Smoker  . Smokeless tobacco: Never Used  Substance and Sexual Activity  . Alcohol use: Yes    Comment: socially  . Drug use: No  . Sexual activity: Not on file  Lifestyle  . Physical activity    Days per week: Not on file    Minutes per session: Not on file  . Stress: Not on file  Relationships  . Social Herbalist on phone: Not on file    Gets together: Not on file    Attends religious service: Not on file    Active member of club or organization: Not on file    Attends meetings of clubs or organizations:  Not on file    Relationship status: Not on file  Other Topics Concern  . Not on file  Social History Narrative   Married, 2 college age sons.   Orig from [place].   Relocated to [facility] 2013.   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   No tobacco or drugs.  Alcohol: occasional.   No Known Allergies Family History  Problem Relation Age of Onset  . Breast cancer Mother   . Cancer Father   . Heart disease Father   . Heart disease Brother   . Colon cancer Neg Hx   . Esophageal cancer Neg Hx     Current Outpatient Medications (Endocrine & Metabolic):  .  metFORMIN (GLUCOPHAGE-XR) 500 MG 24 hr tablet, TAKE 2 TABLETS BY MOUTH TWICE DAILY .  pioglitazone (ACTOS) 45 MG tablet, Take 1 tablet (45 mg total) by mouth daily.  Current Outpatient Medications  (Cardiovascular):  .  amLODipine (NORVASC) 10 MG tablet, TAKE 1 TABLET BY MOUTH DAILY .  fenofibrate (TRICOR) 145 MG tablet, TAKE 1 TABLET BY MOUTH DAILY .  metoprolol succinate (TOPROL-XL) 25 MG 24 hr tablet, TAKE 1 TABLET BY MOUTH DAILY .  telmisartan (MICARDIS) 80 MG tablet, TAKE 1 TABLET BY MOUTH DAILY     Current Outpatient Medications (Other):  .  ciprofloxacin (CIPRO) 500 MG tablet, Take 1 tablet (500 mg total) by mouth 2 (two) times daily for 10 days. .  Diclofenac Sodium (PENNSAID) 2 % SOLN, Place 2 g onto the skin 2 (two) times daily. Marland Kitchen  escitalopram (LEXAPRO) 10 MG tablet, Take 1 tablet (10 mg total) by mouth daily. .  Glucosamine-Chondroitin (OSTEO BI-FLEX REGULAR STRENGTH PO), Take 1 tablet by mouth daily. Marland Kitchen  glucose blood test strip, Use as instructed .  Lancets 30G MISC, Use as directed to check blood sugar .  LORazepam (ATIVAN) 0.5 MG tablet, TAKE 1 TO 2 TABLETS BY MOUTH TWICE DAILY AS NEEDED FOR ANXIETY .  metroNIDAZOLE (FLAGYL) 500 MG tablet, Take 1 tablet (500 mg total) by mouth 3 (three) times daily for 10 days. .  Multiple Vitamin (MULTIVITAMIN) tablet, Take 1 tablet by mouth daily. .  Omega-3 Fatty Acids (FISH OIL PO), Take 1 capsule by mouth daily. .  Psyllium Fiber 0.52 g CAPS, Take 1 capsule by mouth daily.    Past medical history, social, surgical and family history all reviewed in electronic medical record.  No pertanent information unless stated regarding to the chief complaint.   Review of Systems:  No headache, visual changes, nausea, vomiting, diarrhea, constipation, dizziness, abdominal pain, skin rash, fevers, chills, night sweats, weight loss, swollen lymph nodes, body aches, joint swelling, muscle aches, chest pain, shortness of breath, mood changes.   Objective  Blood pressure 110/70, pulse 72, height 6\' 7"  (2.007 m), weight 296 lb (134.3 kg), SpO2 97 %.   General: No apparent distress alert and oriented x3 mood and affect normal, dressed  appropriately.  HEENT: Pupils equal, extraocular movements intact  Respiratory: Patient's speak in full sentences and does not appear short of breath  Cardiovascular: No lower extremity edema, non tender, no erythema  Skin: Warm dry intact with no signs of infection or rash on extremities or on axial skeleton.  Abdomen: Soft nontender  Neuro: Cranial nerves II through XII are intact, neurovascularly intact in all extremities with 2+ DTRs and 2+ pulses.  Lymph: No lymphadenopathy of posterior or anterior cervical chain or axillae bilaterally.  Gait normal with good balance  and coordination.  MSK:  Non tender with full range of motion and good stability and symmetric strength and tone of  elbows, wrist, hip, knee and ankles bilaterally.  Right shoulder exam continues to make improvement.  Patient states that there is some mild crepitus.  Patient feels that the last injection was significantly helpful though.  Patient has had good improvements with range of motion now of 180 degrees forward flexion, full internal range of motion, external rotation of 5 to 10 degrees.    Impression and Recommendations:     . The above documentation has been reviewed and is accurate and complete Brett Pulley, DO       Note: This dictation was prepared with Dragon dictation along with smaller phrase technology. Any transcriptional errors that result from this process are unintentional.

## 2018-12-21 ENCOUNTER — Other Ambulatory Visit: Payer: Self-pay

## 2018-12-21 ENCOUNTER — Encounter: Payer: Self-pay | Admitting: Family Medicine

## 2018-12-21 ENCOUNTER — Ambulatory Visit: Payer: Federal, State, Local not specified - PPO | Admitting: Family Medicine

## 2018-12-21 DIAGNOSIS — M778 Other enthesopathies, not elsewhere classified: Secondary | ICD-10-CM

## 2018-12-21 NOTE — Patient Instructions (Signed)
Good to see you  Ice is your friend Stay active See me again in 6-8 weeks if not perfect and we will inject  Happy holidays!

## 2018-12-21 NOTE — Assessment & Plan Note (Signed)
Significant improvement at this time.  Once again discussed the possibility of x-rays and advanced imaging.  Patient declined with him making significant improvement.  Would state he feels 80 to 85% better.  Patient will continue with physical therapy if he feels it is beneficial.  Follow-up with me again in 4 to 8 weeks

## 2019-01-27 ENCOUNTER — Encounter: Payer: Self-pay | Admitting: Family Medicine

## 2019-02-11 ENCOUNTER — Ambulatory Visit: Payer: Federal, State, Local not specified - PPO | Admitting: Family Medicine

## 2019-02-25 ENCOUNTER — Ambulatory Visit (INDEPENDENT_AMBULATORY_CARE_PROVIDER_SITE_OTHER): Payer: Federal, State, Local not specified - PPO | Admitting: Family Medicine

## 2019-02-25 ENCOUNTER — Encounter: Payer: Self-pay | Admitting: Family Medicine

## 2019-02-25 VITALS — BP 112/82 | HR 67 | Ht 79.0 in | Wt 296.0 lb

## 2019-02-25 DIAGNOSIS — M778 Other enthesopathies, not elsewhere classified: Secondary | ICD-10-CM | POA: Diagnosis not present

## 2019-02-25 DIAGNOSIS — M19011 Primary osteoarthritis, right shoulder: Secondary | ICD-10-CM | POA: Diagnosis not present

## 2019-02-25 NOTE — Assessment & Plan Note (Signed)
Patient has done relatively well overall.  Some mild adhesive capsulitis and appears still remaining.  Discussed patient wished and likely do better.  Otherwise the acromioclavicular joint could use another potential injection.  Patient is in agreement with the plan.  Follow-up again in 4 to 6 weeks

## 2019-02-25 NOTE — Progress Notes (Signed)
[facility] [place] Phone: [phone] Subjective:   Brett Cruz, am serving as a scribe for Dr. Hulan Saas. This visit occurred during the SARS-CoV-2 public health emergency.  Safety protocols were in place, including screening questions prior to the visit, additional usage of staff PPE, and extensive cleaning of exam room while observing appropriate contact time as indicated for disinfecting solutions.    I'm seeing this patient by the request  of:  McGowen, Adrian Blackwater, MD  CC: Right shoulder pain follow-up  [id number]   10/28/2018 Patient given injection today, tolerated the procedure well.  Discussed icing regimen and home exercise, discussed which activities of doing which wants to avoid.  Increase activity as tolerated.  Patient is encouraged to continue with physical therapy.  Hoping that this injection helps the rest of the way.  Did respond fairly well to the acromioclavicular injection previously.  Update 02/25/2019 Brett Cruz is a 59 y.o. male coming in with complaint of right shoulder pain. Patient states that he has felt improvement since last visit. Has been doing PT and had injection 2 visits ago. Pain occurring throughout the day with IR, flexion and adduction. Overall feels that he is improving.  Patient would state that feeling about 85% better overall.    Past Medical History:  Diagnosis Date  . Arthritis of right acromioclavicular joint    steroid inj 09/2018 (Dr. Tamala Julian)  . Cataract   . Chronic venous insufficiency   . Diverticulosis 2009; 2020   Hx of 'itis.  . 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 Zigmund Daniel in [place].  Marland Kitchen GAD (generalized anxiety disorder)   . Glaucoma   . High triglycerides   . History of adenomatous polyp of colon 11/2018   Recall 11/2023.  Marland Kitchen Hypertension approx 2010  . Hypertensive retinopathy of both eyes     Dr. Zigmund Daniel  . Mood disorder ([place])    dep/anx  . Obesity, Class I, BMI 30-34.9   . Right rotator cuff tendinitis    Inj 10/2018 Dr. Verdis Frederickson improved   Past Surgical History:  Procedure Laterality Date  . ANTERIOR CRUCIATE LIGAMENT REPAIR  1985  . COLONOSCOPY  2009. 11/30/18   2009 normal.  11/2018 adenoma.  Recall 5 yrs. +Diverticulosis.  Marland Kitchen HERNIA REPAIR     age 4 and 59 years old  . [place] SURGERY  1999  . TONSILECTOMY, ADENOIDECTOMY, BILATERAL MYRINGOTOMY AND TUBES  1966   Social History   Socioeconomic History  . Marital status: Married    Spouse name: Not on file  . Number of children: Not on file  . Years of education: Not on file  . Highest education level: Not on file  Occupational History  . Not on file  Tobacco Use  . Smoking status: Never Smoker  . Smokeless tobacco: Never Used  Substance and Sexual Activity  . Alcohol use: Yes    Comment: socially  . Drug use: Cruz  . Sexual activity: Not on file  Other Topics Concern  . Not on file  Social History Narrative   Married, 2 college age sons.   Orig from [place].   Relocated to [facility] 2013.   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   Cruz tobacco or drugs.  Alcohol: occasional.   Social Determinants of Health   Financial Resource Strain:   . Difficulty of Paying  Living Expenses: Not on file  Food Insecurity:   . Worried About Charity fundraiser in the Last Year: Not on file  . Ran Out of Food in the Last Year: Not on file  Transportation Needs:   . Lack of Transportation (Medical): Not on file  . Lack of Transportation (Non-Medical): Not on file  Physical Activity:   . Days of Exercise per Week: Not on file  . Minutes of Exercise per Session: Not on file  Stress:   . Feeling of Stress : Not on file  Social Connections:   . Frequency of Communication with Friends and Family: Not on file  . Frequency of Social Gatherings with Friends and Family: Not on file   . Attends Religious Services: Not on file  . Active Member of Clubs or Organizations: Not on file  . Attends Archivist Meetings: Not on file  . Marital Status: Not on file   Cruz Known Allergies Family History  Problem Relation Age of Onset  . Breast cancer Mother   . Cancer Father   . Heart disease Father   . Heart disease Brother   . Colon cancer Neg Hx   . Esophageal cancer Neg Hx     Current Outpatient Medications (Endocrine & Metabolic):  .  metFORMIN (GLUCOPHAGE-XR) 500 MG 24 hr tablet, TAKE 2 TABLETS BY MOUTH TWICE DAILY .  pioglitazone (ACTOS) 45 MG tablet, Take 1 tablet (45 mg total) by mouth daily.  Current Outpatient Medications (Cardiovascular):  .  amLODipine (NORVASC) 10 MG tablet, TAKE 1 TABLET BY MOUTH DAILY .  fenofibrate (TRICOR) 145 MG tablet, TAKE 1 TABLET BY MOUTH DAILY .  metoprolol succinate (TOPROL-XL) 25 MG 24 hr tablet, TAKE 1 TABLET BY MOUTH DAILY .  telmisartan (MICARDIS) 80 MG tablet, TAKE 1 TABLET BY MOUTH DAILY     Current Outpatient Medications (Other):  Marland Kitchen  Diclofenac Sodium (PENNSAID) 2 % SOLN, Place 2 g onto the skin 2 (two) times daily. Marland Kitchen  escitalopram (LEXAPRO) 10 MG tablet, Take 1 tablet (10 mg total) by mouth daily. .  Glucosamine-Chondroitin (OSTEO BI-FLEX REGULAR STRENGTH PO), Take 1 tablet by mouth daily. Marland Kitchen  glucose blood test strip, Use as instructed .  Lancets 30G MISC, Use as directed to check blood sugar .  LORazepam (ATIVAN) 0.5 MG tablet, TAKE 1 TO 2 TABLETS BY MOUTH TWICE DAILY AS NEEDED FOR ANXIETY .  Multiple Vitamin (MULTIVITAMIN) tablet, Take 1 tablet by mouth daily. .  Omega-3 Fatty Acids (FISH OIL PO), Take 1 capsule by mouth daily. .  Psyllium Fiber 0.52 g CAPS, Take 1 capsule by mouth daily.    Past medical history, social, surgical and family history all reviewed in electronic medical record.  Cruz pertanent information unless stated regarding to the chief complaint.   Review of Systems:  Cruz headache,  visual changes, nausea, vomiting, diarrhea, constipation, dizziness, abdominal pain, skin rash, fevers, chills, night sweats, weight loss, swollen lymph nodes, body aches, joint swelling, chest pain, shortness of breath, mood changes. POSITIVE muscle aches  Objective  Blood pressure 112/82, pulse 67, height 6\' 7"  (2.007 m), weight 296 lb (134.3 kg), SpO2 99 %.   General: Cruz apparent distress alert and oriented x3 mood and affect normal, dressed appropriately.  HEENT: Pupils equal, extraocular movements intact  Respiratory: Patient's speak in full sentences and does not appear short of breath  Cardiovascular: Cruz lower extremity edema, non tender, Cruz erythema  Skin: Warm dry intact with Cruz signs of infection  or rash on extremities or on axial skeleton.  Abdomen: Soft nontender  Neuro: Cranial nerves II through XII are intact, neurovascularly intact in all extremities with 2+ DTRs and 2+ pulses.  Lymph: Cruz lymphadenopathy of posterior or anterior cervical chain or axillae bilaterally.  Gait normal with good balance and coordination.  MSK:  tender with full range of motion and good stability and symmetric strength and tone of  elbows, wrist, hip, knee and ankles bilaterally.  Right shoulder still decrease range of motion lacking last 5 degrees of forward flexion in the last 10 degrees of external rotation improvement in internal range of motion. Contralateral shoulder unremarkable.  5 out of 5 strength of the rotator cuff mild positive crossover   Impression and Recommendations:      The above documentation has been reviewed and is accurate and complete Lyndal Pulley, DO       Note: This dictation was prepared with Dragon dictation along with smaller phrase technology. Any transcriptional errors that result from this process are unintentional.

## 2019-03-04 ENCOUNTER — Encounter: Payer: Self-pay | Admitting: Family Medicine

## 2019-03-16 DIAGNOSIS — L821 Other seborrheic keratosis: Secondary | ICD-10-CM | POA: Diagnosis not present

## 2019-03-16 DIAGNOSIS — D229 Melanocytic nevi, unspecified: Secondary | ICD-10-CM | POA: Diagnosis not present

## 2019-03-23 ENCOUNTER — Other Ambulatory Visit: Payer: Self-pay

## 2019-03-23 MED ORDER — PIOGLITAZONE HCL 45 MG PO TABS: 45.0000 mg | ORAL_TABLET | Freq: Every day | ORAL | 0 refills | Status: DC

## 2019-03-30 ENCOUNTER — Other Ambulatory Visit: Payer: Self-pay

## 2019-03-30 DIAGNOSIS — Z03818 Encounter for observation for suspected exposure to other biological agents ruled out: Secondary | ICD-10-CM | POA: Diagnosis not present

## 2019-03-30 DIAGNOSIS — Z20828 Contact with and (suspected) exposure to other viral communicable diseases: Secondary | ICD-10-CM | POA: Diagnosis not present

## 2019-03-30 MED ORDER — TELMISARTAN 80 MG PO TABS: 80.0000 mg | ORAL_TABLET | Freq: Every day | ORAL | 0 refills | Status: DC

## 2019-03-30 MED ORDER — METOPROLOL SUCCINATE ER 25 MG PO TB24: 25.0000 mg | ORAL_TABLET | Freq: Every day | ORAL | 0 refills | Status: DC

## 2019-03-30 MED ORDER — AMLODIPINE BESYLATE 10 MG PO TABS: 10.0000 mg | ORAL_TABLET | Freq: Every day | ORAL | 0 refills | Status: DC

## 2019-03-30 MED ORDER — METFORMIN HCL ER 500 MG PO TB24: 1000.0000 mg | ORAL_TABLET | Freq: Two times a day (BID) | ORAL | 0 refills | Status: DC

## 2019-03-30 MED ORDER — FENOFIBRATE 145 MG PO TABS: 145.0000 mg | ORAL_TABLET | Freq: Every day | ORAL | 0 refills | Status: DC

## 2019-04-26 ENCOUNTER — Other Ambulatory Visit: Payer: Self-pay

## 2019-04-26 ENCOUNTER — Encounter: Payer: Self-pay | Admitting: Family Medicine

## 2019-04-26 ENCOUNTER — Ambulatory Visit: Payer: Federal, State, Local not specified - PPO | Admitting: Family Medicine

## 2019-04-26 VITALS — BP 145/79 | HR 74 | Temp 98.0°F | Resp 16 | Ht 79.0 in | Wt 303.8 lb

## 2019-04-26 DIAGNOSIS — I10 Essential (primary) hypertension: Secondary | ICD-10-CM | POA: Diagnosis not present

## 2019-04-26 DIAGNOSIS — E119 Type 2 diabetes mellitus without complications: Secondary | ICD-10-CM

## 2019-04-26 LAB — BASIC METABOLIC PANEL
BUN: 15 mg/dL (ref 6–23)
CO2: 27 mEq/L (ref 19–32)
Calcium: 9.6 mg/dL (ref 8.4–10.5)
Chloride: 102 mEq/L (ref 96–112)
Creatinine, Ser: 0.7 mg/dL (ref 0.40–1.50)
GFR: 114.97 mL/min (ref >60.00)
Glucose, Bld: 138 mg/dL — ABNORMAL HIGH (ref 70–99)
Potassium: 4.2 mEq/L (ref 3.5–5.1)
Sodium: 138 mEq/L (ref 135–145)

## 2019-04-26 LAB — HEMOGLOBIN A1C: Hgb A1c MFr Bld: 7 % — ABNORMAL HIGH (ref 4.6–6.5)

## 2019-04-26 MED ORDER — PIOGLITAZONE HCL 45 MG PO TABS: 45.0000 mg | ORAL_TABLET | Freq: Every day | ORAL | 3 refills | Status: DC

## 2019-04-26 NOTE — Progress Notes (Signed)
OFFICE VISIT  04/26/2019   CC:  Chief Complaint  Patient presents with  . Follow-up    RCI, pt is fasting    HPI:    Patient is a 59 y.o. Caucasian male who presents for 7 mo f/u HTN, DM, obesity, GAD (benzo). A/P as of last visit: "Health maintenance exam: Reviewed age and gender appropriate health maintenance issues (prudent diet, regular exercise, health risks of tobacco and excessive alcohol, use of seatbelts, fire alarms in home, use of sunscreen).  Also reviewed age and gender appropriate health screening as well as vaccine recommendations. Vaccines: all UTD, including shingrix. Labs: CBC w/diff, CMET, FLP, A1c, PSA--future, when pt fasting. Prostate ca screening: DRE -->pt deferred this. PSA ordered. Colon ca screening: next screening colonoscopy due 2021. First colonoscopy was done in [place], [place].  I have ordered referral to [facility]. Pt requested referral to dermatologist for skin ca screening.  GAD: he is stable with prn use of lorazepam. CSC updated today. Will get UDS when he comes for fasting labs tomorrow."  Interim hx:  DM: better glucs but rare check of gluc---last one was 145, for example. Admits not eating diabetic diet, not exercising any.  Desk job, long hours. Tolerating pioglitazone added on last visit well.  HTN: bp's good at MD visits over the last 4-5 mo but no home monitoring.  GAD: lorazepam long term has been very helpful along with lexapro.  Taking loraz 1 nightly, rare daytime dose. PMP AWARE reviewed today: most recent rx for lorazepam 0.5mg  was filled 01/25/19, # 58, rx by me. No red flags.  ROS: no fevers, no CP, no SOB, no wheezing, no cough, no dizziness, no HAs, no rashes, no melena/hematochezia.  No polyuria or polydipsia.  No myalgias or arthralgias.  No focal weakness, paresthesias, or tremors.  No acute vision or hearing abnormalities. No n/v/d or abd pain.  No palpitations.    Past Medical History:  Diagnosis Date  . Arthritis of  right acromioclavicular joint    steroid inj 09/2018 (Dr. Tamala Julian)  . Cataract   . Chronic venous insufficiency   . Diverticulosis 2009; 2020   Hx of 'itis.  . 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) 2013/14   Retinal break OS: laser treatment 07/2013-Dr. Zigmund Daniel in [place].  Marland Kitchen GAD (generalized anxiety disorder)   . Glaucoma   . High triglycerides   . History of adenomatous polyp of colon 11/2018   Recall 11/2023.  Marland Kitchen Hypertension approx 2010  . Hypertensive retinopathy of both eyes    Dr. Zigmund Daniel  . Mood disorder ([place])    dep/anx  . Obesity, Class I, BMI 30-34.9   . Right rotator cuff tendinitis    + adhesive capsulitis.  Inj 10/2018 Dr. Verdis Frederickson improved    Past Surgical History:  Procedure Laterality Date  . ANTERIOR CRUCIATE LIGAMENT REPAIR  1985  . COLONOSCOPY  2009. 11/30/18   2009 normal.  11/2018 adenoma.  Recall 5 yrs. +Diverticulosis.  Marland Kitchen HERNIA REPAIR     age 39 and 59 years old  . [place] SURGERY  1999  . TONSILECTOMY, ADENOIDECTOMY, BILATERAL MYRINGOTOMY AND TUBES  1966    Outpatient Medications Prior to Visit  Medication Sig Dispense Refill  . amLODipine (NORVASC) 10 MG tablet Take 1 tablet (10 mg total) by mouth daily. 90 tablet 0  . escitalopram (LEXAPRO) 10 MG tablet Take 1 tablet (10 mg total) by mouth daily. 30 tablet 5  . fenofibrate (TRICOR) 145 MG tablet Take 1  tablet (145 mg total) by mouth daily. 90 tablet 0  . Glucosamine-Chondroitin (OSTEO BI-FLEX REGULAR STRENGTH PO) Take 1 tablet by mouth daily.    Marland Kitchen glucose blood test strip Use as instructed 100 each 12  . Lancets 30G MISC Use as directed to check blood sugar 100 each 11  . LORazepam (ATIVAN) 0.5 MG tablet TAKE 1 TO 2 TABLETS BY MOUTH TWICE DAILY AS NEEDED FOR ANXIETY 60 tablet 5  . metFORMIN (GLUCOPHAGE-XR) 500 MG 24 hr tablet Take 2 tablets (1,000 mg total) by mouth 2 (two) times daily. 360 tablet 0  . metoprolol succinate (TOPROL-XL) 25 MG 24 hr tablet Take 1 tablet (25 mg total)  by mouth daily. 90 tablet 0  . Multiple Vitamin (MULTIVITAMIN) tablet Take 1 tablet by mouth daily.    . Omega-3 Fatty Acids (FISH OIL PO) Take 1 capsule by mouth daily.    . Psyllium Fiber 0.52 g CAPS Take 1 capsule by mouth daily.    Marland Kitchen telmisartan (MICARDIS) 80 MG tablet Take 1 tablet (80 mg total) by mouth daily. 90 tablet 0  . pioglitazone (ACTOS) 45 MG tablet Take 1 tablet (45 mg total) by mouth daily. 30 tablet 0  . Diclofenac Sodium (PENNSAID) 2 % SOLN Place 2 g onto the skin 2 (two) times daily. (Patient not taking: Reported on 04/26/2019) 112 g 3   No facility-administered medications prior to visit.    No Known Allergies  ROS As per HPI  PE: Initial bp 145/79 automated, repeat end of visit 132/80. Blood pressure (!) 145/79, pulse 74, temperature 98 F (36.7 C), temperature source Temporal, resp. rate 16, height 6\' 7"  (2.007 m), weight (!) 303 lb 12.8 oz (137.8 kg), SpO2 99 %. Body mass index is 34.22 kg/m.  Gen: Alert, well appearing.  Patient is oriented to person, place, time, and situation. AFFECT: pleasant, lucid thought and speech. CV: RRR, no m/r/g.   LUNGS: CTA bilat, nonlabored resps, good aeration in all lung fields. EXT: no clubbing or cyanosis.  3+ pitting edema RLL, 2+ pitting edema LLL.    LABS:  Lab Results  Component Value Date   TSH 2.04 05/08/2017   Lab Results  Component Value Date   WBC 6.4 09/22/2018   HGB 14.9 09/22/2018   HCT 43.7 09/22/2018   MCV 91.4 09/22/2018   PLT 223.0 09/22/2018   Lab Results  Component Value Date   CREATININE 0.63 09/22/2018   BUN 13 09/22/2018   NA 137 09/22/2018   K 4.3 09/22/2018   CL 102 09/22/2018   CO2 27 09/22/2018   Lab Results  Component Value Date   ALT 21 09/22/2018   AST 15 09/22/2018   ALKPHOS 70 09/22/2018   BILITOT 0.6 09/22/2018   Lab Results  Component Value Date   CHOL 124 09/22/2018   Lab Results  Component Value Date   HDL 37.00 (L) 09/22/2018   Lab Results  Component Value  Date   LDLCALC 54 09/22/2018   Lab Results  Component Value Date   TRIG 166.0 (H) 09/22/2018   Lab Results  Component Value Date   CHOLHDL 3 09/22/2018   Lab Results  Component Value Date   PSA 0.69 09/22/2018   PSA 0.59 05/08/2017   PSA 0.76 05/06/2016    Lab Results  Component Value Date   HGBA1C 8.0 (H) 09/22/2018    IMPRESSION AND PLAN:  1) DM 2: unclear control b/c lack of home monitoring. Noncompliant with diet/exercise. UTD on feet and eye  exam. Needs urine microalb/cr will be done at next f/u. Hba1c and BMET today.  2) HTN: The current medical regimen is effective (toprol xl, amlodipine, and telmisartan);  continue present plan and medications. BMET today.  3) GAD: doing well long term on nightly benzo, occ daytime dose, also daily lexapro. No changes. CSC and UDS UTD.  An After Visit Summary was printed and given to the patient.  FOLLOW UP: Return in about 3 months (around 07/27/2019) for routine chronic illness f/u.  Signed:  Crissie Sickles, MD           04/26/2019

## 2019-05-10 ENCOUNTER — Other Ambulatory Visit: Payer: Self-pay

## 2019-05-10 MED ORDER — ESCITALOPRAM OXALATE 10 MG PO TABS: 10.0000 mg | ORAL_TABLET | Freq: Every day | ORAL | 5 refills | Status: DC

## 2019-05-11 ENCOUNTER — Other Ambulatory Visit: Payer: Self-pay

## 2019-05-11 NOTE — Telephone Encounter (Signed)
Requesting: Lorazepam Contract:09/16/18 UDS:09/22/18 Last Visit:04/26/19 Next Visit:08/02/19 Last Refill:10/02/18(60,5)  Please Advise. Medication pending

## 2019-05-12 MED ORDER — LORAZEPAM 0.5 MG PO TABS: ORAL_TABLET | ORAL | 5 refills | Status: DC

## 2019-05-13 NOTE — Telephone Encounter (Signed)
Rx sent 

## 2019-07-01 ENCOUNTER — Other Ambulatory Visit: Payer: Self-pay

## 2019-07-01 MED ORDER — FENOFIBRATE 145 MG PO TABS: 145.0000 mg | ORAL_TABLET | Freq: Every day | ORAL | 0 refills | Status: DC

## 2019-07-01 MED ORDER — METFORMIN HCL ER 500 MG PO TB24: 1000.0000 mg | ORAL_TABLET | Freq: Two times a day (BID) | ORAL | 0 refills | Status: DC

## 2019-07-01 MED ORDER — TELMISARTAN 80 MG PO TABS: 80.0000 mg | ORAL_TABLET | Freq: Every day | ORAL | 0 refills | Status: DC

## 2019-07-01 MED ORDER — AMLODIPINE BESYLATE 10 MG PO TABS: 10.0000 mg | ORAL_TABLET | Freq: Every day | ORAL | 0 refills | Status: DC

## 2019-07-01 MED ORDER — METOPROLOL SUCCINATE ER 25 MG PO TB24: 25.0000 mg | ORAL_TABLET | Freq: Every day | ORAL | 0 refills | Status: DC

## 2019-08-02 ENCOUNTER — Ambulatory Visit: Payer: Federal, State, Local not specified - PPO | Admitting: Family Medicine

## 2019-08-02 ENCOUNTER — Encounter: Payer: Self-pay | Admitting: Family Medicine

## 2019-08-02 ENCOUNTER — Other Ambulatory Visit: Payer: Self-pay

## 2019-08-02 VITALS — BP 136/84 | HR 68 | Temp 97.9°F | Resp 16 | Ht 79.0 in | Wt 306.0 lb

## 2019-08-02 DIAGNOSIS — E781 Pure hyperglyceridemia: Secondary | ICD-10-CM

## 2019-08-02 DIAGNOSIS — I10 Essential (primary) hypertension: Secondary | ICD-10-CM

## 2019-08-02 DIAGNOSIS — E118 Type 2 diabetes mellitus with unspecified complications: Secondary | ICD-10-CM

## 2019-08-02 LAB — MICROALBUMIN / CREATININE URINE RATIO
Creatinine,U: 58.6 mg/dL
Microalb Creat Ratio: 2.4 mg/g (ref 0.0–30.0)
Microalb, Ur: 1.4 mg/dL (ref 0.0–1.9)

## 2019-08-02 LAB — BASIC METABOLIC PANEL
BUN: 14 mg/dL (ref 6–23)
CO2: 26 mEq/L (ref 19–32)
Calcium: 9 mg/dL (ref 8.4–10.5)
Chloride: 101 mEq/L (ref 96–112)
Creatinine, Ser: 0.61 mg/dL (ref 0.40–1.50)
GFR: 134.63 mL/min (ref >60.00)
Glucose, Bld: 147 mg/dL — ABNORMAL HIGH (ref 70–99)
Potassium: 4.1 mEq/L (ref 3.5–5.1)
Sodium: 137 mEq/L (ref 135–145)

## 2019-08-02 LAB — HEMOGLOBIN A1C: Hgb A1c MFr Bld: 7.5 % — ABNORMAL HIGH (ref 4.6–6.5)

## 2019-08-02 MED ORDER — AMLODIPINE BESYLATE 10 MG PO TABS: 10.0000 mg | ORAL_TABLET | Freq: Every day | ORAL | 0 refills | Status: DC

## 2019-08-02 MED ORDER — METFORMIN HCL ER 500 MG PO TB24: 1000.0000 mg | ORAL_TABLET | Freq: Two times a day (BID) | ORAL | 0 refills | Status: DC

## 2019-08-02 MED ORDER — METOPROLOL SUCCINATE ER 25 MG PO TB24: 25.0000 mg | ORAL_TABLET | Freq: Every day | ORAL | 0 refills | Status: DC

## 2019-08-02 MED ORDER — FENOFIBRATE 145 MG PO TABS: 145.0000 mg | ORAL_TABLET | Freq: Every day | ORAL | 0 refills | Status: DC

## 2019-08-02 MED ORDER — TELMISARTAN 80 MG PO TABS: 80.0000 mg | ORAL_TABLET | Freq: Every day | ORAL | 0 refills | Status: DC

## 2019-08-02 NOTE — Progress Notes (Signed)
OFFICE VISIT  08/02/2019   CC:  Chief Complaint  Patient presents with  . Follow-up    RCI, pt is fasting   HPI:    Patient is a 59 y.o. Caucasian male who presents for 3 mo f/u HTN, DM, HLD A/P as of last visit: "1) DM 2: unclear control b/c lack of home monitoring. Noncompliant with diet/exercise. UTD on feet and eye exam. Needs urine microalb/cr will be done at next f/u. Hba1c and BMET today.  2) HTN: The current medical regimen is effective (toprol xl, amlodipine, and telmisartan);  continue present plan and medications. BMET today.  3) GAD: doing well long term on nightly benzo, occ daytime dose, also daily lexapro. No changes. CSC and UDS UTD.  INTERIM HX:  Just kind of feel tired. Eating a lot of bad food, getting his house ready to sell. No acute complaints.  HTN: no home bp monitoring. DM: no home gluc monitoring.  No diabetic diet, no exercise. Hypertrig: diet is poor, no exercise.  Compliant with fibrate.  ROS: no fevers, no CP, no SOB, no wheezing, no cough, no dizziness, no HAs, no rashes, no melena/hematochezia.  No polyuria or polydipsia.  No myalgias or arthralgias.  No focal weakness, paresthesias, or tremors.  No acute vision or hearing abnormalities. No n/v/d or abd pain.  No palpitations.      Past Medical History:  Diagnosis Date  . Arthritis of right acromioclavicular joint    steroid inj 09/2018 (Dr. Tamala Julian)  . Cataract   . Chronic venous insufficiency   . Diverticulosis 2009; 2020   Hx of 'itis.  . 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 Zigmund Daniel in [place].  Marland Kitchen GAD (generalized anxiety disorder)   . Glaucoma   . High triglycerides   . History of adenomatous polyp of colon 11/2018   Recall 11/2023.  Marland Kitchen Hypertension approx 2010  . Hypertensive retinopathy of both eyes    Dr. Zigmund Daniel  . Mood disorder ([place])    dep/anx  . Obesity, Class I, BMI 30-34.9   . Right rotator cuff  tendinitis    + adhesive capsulitis.  Inj 10/2018 Dr. Verdis Frederickson improved    Past Surgical History:  Procedure Laterality Date  . ANTERIOR CRUCIATE LIGAMENT REPAIR  1985  . COLONOSCOPY  2009. 11/30/18   2009 normal.  11/2018 adenoma.  Recall 5 yrs. +Diverticulosis.  Marland Kitchen HERNIA REPAIR     age 1 and 59 years old  . [place] SURGERY  1999  . TONSILECTOMY, ADENOIDECTOMY, BILATERAL MYRINGOTOMY AND TUBES  1966    Outpatient Medications Prior to Visit  Medication Sig Dispense Refill  . amLODipine (NORVASC) 10 MG tablet Take 1 tablet (10 mg total) by mouth daily. 30 tablet 0  . escitalopram (LEXAPRO) 10 MG tablet Take 1 tablet (10 mg total) by mouth daily. 30 tablet 5  . fenofibrate (TRICOR) 145 MG tablet Take 1 tablet (145 mg total) by mouth daily. 30 tablet 0  . Glucosamine-Chondroitin (OSTEO BI-FLEX REGULAR STRENGTH PO) Take 1 tablet by mouth daily.    Marland Kitchen glucose blood test strip Use as instructed 100 each 12  . Lancets 30G MISC Use as directed to check blood sugar 100 each 11  . LORazepam (ATIVAN) 0.5 MG tablet TAKE 1 TO 2 TABLETS BY MOUTH TWICE DAILY AS NEEDED FOR ANXIETY 60 tablet 5  . metFORMIN (GLUCOPHAGE-XR) 500 MG 24 hr tablet Take 2 tablets (1,000 mg total) by mouth  2 (two) times daily. 120 tablet 0  . metoprolol succinate (TOPROL-XL) 25 MG 24 hr tablet Take 1 tablet (25 mg total) by mouth daily. 30 tablet 0  . Multiple Vitamin (MULTIVITAMIN) tablet Take 1 tablet by mouth daily.    . Omega-3 Fatty Acids (FISH OIL PO) Take 1 capsule by mouth daily.    . pioglitazone (ACTOS) 45 MG tablet Take 1 tablet (45 mg total) by mouth daily. 90 tablet 3  . Psyllium Fiber 0.52 g CAPS Take 1 capsule by mouth daily.    Marland Kitchen telmisartan (MICARDIS) 80 MG tablet Take 1 tablet (80 mg total) by mouth daily. 30 tablet 0  . Diclofenac Sodium (PENNSAID) 2 % SOLN Place 2 g onto the skin 2 (two) times daily. (Patient not taking: Reported on 04/26/2019) 112 g 3   No facility-administered medications prior to  visit.    No Known Allergies  ROS As per HPI  PE: Vitals with BMI 08/02/2019 04/26/2019 02/25/2019  Height 6\' 7"  6\' 7"  6\' 7"   Weight 306 lbs 303 lbs 13 oz 296 lbs  BMI 34.46 88.41 66.06  Systolic 301 601 093  Diastolic 84 79 82  Pulse 68 74 67  O2 sat on RA today is 98%  Gen: Alert, well appearing.  Patient is oriented to person, place, time, and situation. AFFECT: pleasant, lucid thought and speech. CV: RRR, no m/r/g.   LUNGS: CTA bilat, nonlabored resps, good aeration in all lung fields. EXT: no clubbing or cyanosis.  2+ pitting bilat LE edema.    LABS:  Lab Results  Component Value Date   TSH 2.04 05/08/2017   Lab Results  Component Value Date   WBC 6.4 09/22/2018   HGB 14.9 09/22/2018   HCT 43.7 09/22/2018   MCV 91.4 09/22/2018   PLT 223.0 09/22/2018   Lab Results  Component Value Date   CREATININE 0.70 04/26/2019   BUN 15 04/26/2019   NA 138 04/26/2019   K 4.2 04/26/2019   CL 102 04/26/2019   CO2 27 04/26/2019   Lab Results  Component Value Date   ALT 21 09/22/2018   AST 15 09/22/2018   ALKPHOS 70 09/22/2018   BILITOT 0.6 09/22/2018   Lab Results  Component Value Date   CHOL 124 09/22/2018   Lab Results  Component Value Date   HDL 37.00 (L) 09/22/2018   Lab Results  Component Value Date   LDLCALC 54 09/22/2018   Lab Results  Component Value Date   TRIG 166.0 (H) 09/22/2018   Lab Results  Component Value Date   CHOLHDL 3 09/22/2018   Lab Results  Component Value Date   PSA 0.69 09/22/2018   PSA 0.59 05/08/2017   PSA 0.76 05/06/2016   Lab Results  Component Value Date   HGBA1C 7.0 (H) 04/26/2019    IMPRESSION AND PLAN:  1) DM: HbA1c and urine microal/cr today. He is keeping up with his dia rtpthy issues with Dr. Zigmund Daniel, says all is fine right now.  2) HLD: trigs good 09/2018, LDL <70 at that tiime as well. Should be on statin but he declines.  Tolerating fibrate.  Plan recheck FLP and hepatic panel 3 mo.  3) HTN: The  current medical regimen is effective;  continue present plan and medications. Lytes/cr today.  An After Visit Summary was printed and given to the patient.  FOLLOW UP: Return in about 3 months (around 11/02/2019) for annual CPE (fasting).  Signed:  Crissie Sickles, MD  08/02/2019     

## 2019-08-30 ENCOUNTER — Other Ambulatory Visit: Payer: Self-pay | Admitting: Family Medicine

## 2019-09-29 ENCOUNTER — Other Ambulatory Visit: Payer: Self-pay | Admitting: Family Medicine

## 2019-11-01 ENCOUNTER — Other Ambulatory Visit: Payer: Self-pay

## 2019-11-01 MED ORDER — FENOFIBRATE 145 MG PO TABS: 145.0000 mg | ORAL_TABLET | Freq: Every day | ORAL | 0 refills | Status: DC

## 2019-11-01 MED ORDER — TELMISARTAN 80 MG PO TABS: 80.0000 mg | ORAL_TABLET | Freq: Every day | ORAL | 0 refills | Status: DC

## 2019-11-01 MED ORDER — METOPROLOL SUCCINATE ER 25 MG PO TB24: 25.0000 mg | ORAL_TABLET | Freq: Every day | ORAL | 0 refills | Status: DC

## 2019-11-09 ENCOUNTER — Encounter: Payer: Self-pay | Admitting: Family Medicine

## 2019-11-09 ENCOUNTER — Other Ambulatory Visit: Payer: Self-pay

## 2019-11-09 ENCOUNTER — Ambulatory Visit (INDEPENDENT_AMBULATORY_CARE_PROVIDER_SITE_OTHER): Payer: Federal, State, Local not specified - PPO | Admitting: Family Medicine

## 2019-11-09 VITALS — BP 125/77 | HR 75 | Temp 97.6°F | Resp 16 | Ht 77.75 in | Wt 298.8 lb

## 2019-11-09 DIAGNOSIS — I10 Essential (primary) hypertension: Secondary | ICD-10-CM

## 2019-11-09 DIAGNOSIS — Z79899 Other long term (current) drug therapy: Secondary | ICD-10-CM

## 2019-11-09 DIAGNOSIS — E113299 Type 2 diabetes mellitus with mild nonproliferative diabetic retinopathy without macular edema, unspecified eye: Secondary | ICD-10-CM

## 2019-11-09 DIAGNOSIS — F419 Anxiety disorder, unspecified: Secondary | ICD-10-CM | POA: Diagnosis not present

## 2019-11-09 DIAGNOSIS — E781 Pure hyperglyceridemia: Secondary | ICD-10-CM

## 2019-11-09 DIAGNOSIS — F32A Depression, unspecified: Secondary | ICD-10-CM

## 2019-11-09 DIAGNOSIS — Z125 Encounter for screening for malignant neoplasm of prostate: Secondary | ICD-10-CM | POA: Diagnosis not present

## 2019-11-09 DIAGNOSIS — Z0000 Encounter for general adult medical examination without abnormal findings: Secondary | ICD-10-CM

## 2019-11-09 LAB — COMPREHENSIVE METABOLIC PANEL
ALT: 22 U/L (ref 0–53)
AST: 15 U/L (ref 0–37)
Albumin: 4.3 g/dL (ref 3.5–5.2)
Alkaline Phosphatase: 62 U/L (ref 39–117)
BUN: 18 mg/dL (ref 6–23)
CO2: 28 mEq/L (ref 19–32)
Calcium: 9.5 mg/dL (ref 8.4–10.5)
Chloride: 102 mEq/L (ref 96–112)
Creatinine, Ser: 0.79 mg/dL (ref 0.40–1.50)
GFR: 97.11 mL/min (ref >60.00)
Glucose, Bld: 146 mg/dL — ABNORMAL HIGH (ref 70–99)
Potassium: 3.9 mEq/L (ref 3.5–5.1)
Sodium: 138 mEq/L (ref 135–145)
Total Bilirubin: 0.4 mg/dL (ref 0.2–1.2)
Total Protein: 7 g/dL (ref 6.0–8.3)

## 2019-11-09 LAB — CBC WITH DIFFERENTIAL/PLATELET
Basophils Absolute: 0.1 10*3/uL (ref 0.0–0.1)
Basophils Relative: 0.8 % (ref 0.0–3.0)
Eosinophils Absolute: 0.2 10*3/uL (ref 0.0–0.7)
Eosinophils Relative: 2.5 % (ref 0.0–5.0)
HCT: 42.2 % (ref 39.0–52.0)
Hemoglobin: 14.3 g/dL (ref 13.0–17.0)
Lymphocytes Relative: 35.4 % (ref 12.0–46.0)
Lymphs Abs: 2.8 10*3/uL (ref 0.7–4.0)
MCHC: 33.9 g/dL (ref 30.0–36.0)
MCV: 90.8 fl (ref 78.0–100.0)
Monocytes Absolute: 0.7 10*3/uL (ref 0.1–1.0)
Monocytes Relative: 8.4 % (ref 3.0–12.0)
Neutro Abs: 4.1 10*3/uL (ref 1.4–7.7)
Neutrophils Relative %: 52.9 % (ref 43.0–77.0)
Platelets: 252 10*3/uL (ref 150.0–400.0)
RBC: 4.65 Mil/uL (ref 4.22–5.81)
RDW: 13.2 % (ref 11.5–15.5)
WBC: 7.8 10*3/uL (ref 4.0–10.5)

## 2019-11-09 LAB — LIPID PANEL
Cholesterol: 131 mg/dL (ref 0–200)
HDL: 38.1 mg/dL — ABNORMAL LOW (ref >39.00)
LDL Cholesterol: 59 mg/dL (ref 0–99)
NonHDL: 92.66
Total CHOL/HDL Ratio: 3
Triglycerides: 166 mg/dL — ABNORMAL HIGH (ref 0.0–149.0)
VLDL: 33.2 mg/dL (ref 0.0–40.0)

## 2019-11-09 LAB — HEMOGLOBIN A1C: Hgb A1c MFr Bld: 7.5 % — ABNORMAL HIGH (ref 4.6–6.5)

## 2019-11-09 LAB — PSA: PSA: 0.57 ng/mL (ref 0.10–4.00)

## 2019-11-09 LAB — TSH: TSH: 2.82 u[IU]/mL (ref 0.35–4.50)

## 2019-11-09 NOTE — Progress Notes (Signed)
Office Note 11/09/2019  CC:  Chief Complaint  Patient presents with  . Annual Exam    pt is fasting    HPI:  Brett Cruz is a 59 y.o. White male who is here for annual health maintenance exam and 3 mo f/u DM, HTN, and hypertriglyceridemia.  Feeling fine, no acute complaints.  Working 12-14 hour days,weekends too, big project at work.  Considering retiring pretty soon.  Has a grandbaby on the way. He sold his house on first day it was listed!  DM: A1c was up to 7.5% last visit so I recommended adding low dose glipizide xl but he declined and preferred to push harder on TLC. Feet: no burning,tingling, or numbness.    HTN: no home monitoring. Compliant with meds.  Hypertrig: compliant with med. Working long hours and says diet not good, no exercise.  Mood and anxiety levels stable/good on lexapro 10mg  qd and 0.5mg  loraz qd long term. PMP AWARE reviewed today: most recent rx for lorazepam 0.5mg  was filled 10/27/19, # 19, rx by me. No red flags.  Past Medical History:  Diagnosis Date  . Arthritis of right acromioclavicular joint    steroid inj 09/2018 (Dr. Tamala Julian)  . Cataract   . Chronic venous insufficiency   . Diverticulosis 2009; 2020   Hx of 'itis.  . 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 Zigmund Daniel in [place].  Marland Kitchen GAD (generalized anxiety disorder)   . Glaucoma   . High triglycerides   . History of adenomatous polyp of colon 11/2018   Recall 11/2023.  Marland Kitchen Hypertension approx 2010  . Hypertensive retinopathy of both eyes    Dr. Zigmund Daniel  . Mood disorder ([place])    dep/anx  . Obesity, Class I, BMI 30-34.9   . Right rotator cuff tendinitis    + adhesive capsulitis.  Inj 10/2018 Dr. Verdis Frederickson improved    Past Surgical History:  Procedure Laterality Date  . ANTERIOR CRUCIATE LIGAMENT REPAIR  1985  . COLONOSCOPY  2009. 11/30/18   2009 normal.  11/2018 adenoma.  Recall 5 yrs. +Diverticulosis.  Marland Kitchen HERNIA  REPAIR     age 27 and 59 years old  . [place] SURGERY  1999  . TONSILECTOMY, ADENOIDECTOMY, BILATERAL MYRINGOTOMY AND TUBES  1966    Family History  Problem Relation Age of Onset  . Breast cancer Mother   . Cancer Father   . Heart disease Father   . Heart disease Brother   . Colon cancer Neg Hx   . Esophageal cancer Neg Hx     Social History   Socioeconomic History  . Marital status: Married    Spouse name: Not on file  . Number of children: Not on file  . Years of education: Not on file  . Highest education level: Not on file  Occupational History  . Not on file  Tobacco Use  . Smoking status: Never Smoker  . Smokeless tobacco: Never Used  Vaping Use  . Vaping Use: Never used  Substance and Sexual Activity  . Alcohol use: Yes    Comment: socially  . Drug use: No  . Sexual activity: Not on file  Other Topics Concern  . Not on file  Social History Narrative   Married, 2 college age sons.   Orig from [place].   Relocated to [facility] 2013.   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   No tobacco or drugs.  Alcohol: occasional.   Social Determinants of Health   Financial Resource Strain:   . Difficulty of Paying Living Expenses: Not on file  Food Insecurity:   . Worried About Charity fundraiser in the Last Year: Not on file  . Ran Out of Food in the Last Year: Not on file  Transportation Needs:   . Lack of Transportation (Medical): Not on file  . Lack of Transportation (Non-Medical): Not on file  Physical Activity:   . Days of Exercise per Week: Not on file  . Minutes of Exercise per Session: Not on file  Stress:   . Feeling of Stress : Not on file  Social Connections:   . Frequency of Communication with Friends and Family: Not on file  . Frequency of Social Gatherings with Friends and Family: Not on file  . Attends Religious Services: Not on file  . Active Member of Clubs or Organizations: Not on file  . Attends Theatre manager Meetings: Not on file  . Marital Status: Not on file  Intimate Partner Violence:   . Fear of Current or Ex-Partner: Not on file  . Emotionally Abused: Not on file  . Physically Abused: Not on file  . Sexually Abused: Not on file    Outpatient Medications Prior to Visit  Medication Sig Dispense Refill  . amLODipine (NORVASC) 10 MG tablet Take 1 tablet (10 mg total) by mouth daily. 30 tablet 0  . escitalopram (LEXAPRO) 10 MG tablet TAKE 1 TABLET(10 MG) BY MOUTH DAILY 30 tablet 2  . fenofibrate (TRICOR) 145 MG tablet Take 1 tablet (145 mg total) by mouth daily. 30 tablet 0  . Glucosamine-Chondroitin (OSTEO BI-FLEX REGULAR STRENGTH PO) Take 1 tablet by mouth daily.    Marland Kitchen LORazepam (ATIVAN) 0.5 MG tablet TAKE 1 TO 2 TABLETS BY MOUTH TWICE DAILY AS NEEDED FOR ANXIETY 60 tablet 5  . metFORMIN (GLUCOPHAGE-XR) 500 MG 24 hr tablet TAKE 2 TABLETS(1000 MG) BY MOUTH TWICE DAILY 120 tablet 2  . metoprolol succinate (TOPROL-XL) 25 MG 24 hr tablet Take 1 tablet (25 mg total) by mouth daily. 30 tablet 0  . Multiple Vitamin (MULTIVITAMIN) tablet Take 1 tablet by mouth daily.    . Omega-3 Fatty Acids (FISH OIL PO) Take 1 capsule by mouth daily.    . pioglitazone (ACTOS) 45 MG tablet Take 1 tablet (45 mg total) by mouth daily. 90 tablet 3  . telmisartan (MICARDIS) 80 MG tablet Take 1 tablet (80 mg total) by mouth daily. 30 tablet 0  . glucose blood test strip Use as instructed (Patient not taking: Reported on 11/09/2019) 100 each 12  . Lancets 30G MISC Use as directed to check blood sugar (Patient not taking: Reported on 11/09/2019) 100 each 11  . Psyllium Fiber 0.52 g CAPS Take 1 capsule by mouth daily. (Patient not taking: Reported on 11/09/2019)     No facility-administered medications prior to visit.    No Known Allergies  ROS Review of Systems  Constitutional: Negative for appetite change, chills, fatigue and fever.  HENT: Negative for congestion, dental problem, ear pain and sore throat.    Eyes: Negative for discharge, redness and visual disturbance.  Respiratory: Negative for cough, chest tightness, shortness of breath and wheezing.   Cardiovascular: Negative for chest pain, palpitations and leg swelling.  Gastrointestinal: Negative for abdominal pain, blood in stool, diarrhea, nausea and vomiting.  Genitourinary: Negative for difficulty urinating, dysuria, flank pain, frequency, hematuria and urgency.  Musculoskeletal: Negative for arthralgias, back  pain, joint swelling, myalgias and neck stiffness.  Skin: Negative for pallor and rash.  Neurological: Negative for dizziness, speech difficulty, weakness and headaches.  Hematological: Negative for adenopathy. Does not bruise/bleed easily.  Psychiatric/Behavioral: Negative for confusion and sleep disturbance. The patient is not nervous/anxious.     PE; Vitals with BMI 11/09/2019 08/02/2019 04/26/2019  Height 6' 5.75" 6\' 7"  6\' 7"   Weight 298 lbs 13 oz 306 lbs 303 lbs 13 oz  BMI 34.75 83.15 17.61  Systolic 607 371 062  Diastolic 77 84 79  Pulse 75 68 74     Gen: Alert, well appearing.  Patient is oriented to person, place, time, and situation. AFFECT: pleasant, lucid thought and speech. ENT: Ears: EACs clear, normal epithelium.  TMs with good light reflex and landmarks bilaterally.  Eyes: no injection, icteris, swelling, or exudate.  EOMI, PERRLA. Nose: no drainage or turbinate edema/swelling.  No injection or focal lesion.  Mouth: lips without lesion/swelling.  Oral mucosa pink and moist.  Dentition intact and without obvious caries or gingival swelling.  Oropharynx without erythema, exudate, or swelling.  Neck: supple/nontender.  No LAD, mass, or TM.  Carotid pulses 2+ bilaterally, without bruits. CV: RRR, no m/r/g.   LUNGS: CTA bilat, nonlabored resps, good aeration in all lung fields. ABD: soft, NT, ND, BS normal.  No hepatospenomegaly or mass.  No bruits. EXT: no clubbing, cyanosis, or edema.  Musculoskeletal: no joint  swelling, erythema, warmth, or tenderness.  ROM of all joints intact. Skin - no sores or suspicious lesions or rashes or color changes Rectal exam: negative without mass, lesions or tenderness, PROSTATE EXAM: smooth and symmetric without nodules or tenderness.   Pertinent labs:  Lab Results  Component Value Date   TSH 2.04 05/08/2017   Lab Results  Component Value Date   WBC 6.4 09/22/2018   HGB 14.9 09/22/2018   HCT 43.7 09/22/2018   MCV 91.4 09/22/2018   PLT 223.0 09/22/2018   Lab Results  Component Value Date   CREATININE 0.61 08/02/2019   BUN 14 08/02/2019   NA 137 08/02/2019   K 4.1 08/02/2019   CL 101 08/02/2019   CO2 26 08/02/2019   Lab Results  Component Value Date   ALT 21 09/22/2018   AST 15 09/22/2018   ALKPHOS 70 09/22/2018   BILITOT 0.6 09/22/2018   Lab Results  Component Value Date   CHOL 124 09/22/2018   Lab Results  Component Value Date   HDL 37.00 (L) 09/22/2018   Lab Results  Component Value Date   LDLCALC 54 09/22/2018   Lab Results  Component Value Date   TRIG 166.0 (H) 09/22/2018   Lab Results  Component Value Date   CHOLHDL 3 09/22/2018   Lab Results  Component Value Date   PSA 0.69 09/22/2018   PSA 0.59 05/08/2017   PSA 0.76 05/06/2016   Lab Results  Component Value Date   HGBA1C 7.5 (H) 08/02/2019   ASSESSMENT AND PLAN:   1) DM  2) HTN  3) Hypertrig  4) Anx/dep: doing well on lexapro and qd ativan. CSC updated. No new rx's for these meds needed today.  5) Health maintenance exam: Reviewed age and gender appropriate health maintenance issues (prudent diet, regular exercise, health risks of tobacco and excessive alcohol, use of seatbelts, fire alarms in home, use of sunscreen).  Also reviewed age and gender appropriate health screening as well as vaccine recommendations. Vaccines: ALL UTD. Labs: CBC, CMET, FLP, TSH, A1c, PSA. Prostate ca screening:  DRE normal, PSA. Colon ca screening: hx polyps, recall  11/2021.  An After Visit Summary was printed and given to the patient.  FOLLOW UP:  Return in about 3 months (around 02/09/2020) for routine chronic illness f/u.  Signed:  Crissie Sickles, MD           11/09/2019

## 2019-11-09 NOTE — Patient Instructions (Signed)

## 2019-11-12 ENCOUNTER — Telehealth: Payer: Self-pay

## 2019-11-12 ENCOUNTER — Other Ambulatory Visit: Payer: Self-pay

## 2019-11-12 MED ORDER — AMLODIPINE BESYLATE 10 MG PO TABS
10.0000 mg | ORAL_TABLET | Freq: Every day | ORAL | 1 refills | Status: DC
Start: 2019-11-12 — End: 2020-04-17

## 2019-11-12 NOTE — Telephone Encounter (Signed)
90 d/s with 1 refill sent to requested pharmacy. Pharmacy was updated during appt on 10/5. Left detailed message advising refill sent. Okay per dpr

## 2019-11-12 NOTE — Telephone Encounter (Signed)
Patient refill request .  No refills. Patient seen recently.  amLODipine (NORVASC) 10 MG tablet [[id number]]   Change location of pharmacy  [facility] [address] - [address], [address]

## 2019-11-18 ENCOUNTER — Encounter (INDEPENDENT_AMBULATORY_CARE_PROVIDER_SITE_OTHER): Payer: Federal, State, Local not specified - PPO | Admitting: Ophthalmology

## 2019-11-18 ENCOUNTER — Other Ambulatory Visit: Payer: Self-pay

## 2019-11-18 DIAGNOSIS — H35033 Hypertensive retinopathy, bilateral: Secondary | ICD-10-CM

## 2019-11-18 DIAGNOSIS — I10 Essential (primary) hypertension: Secondary | ICD-10-CM

## 2019-11-18 DIAGNOSIS — H33302 Unspecified retinal break, left eye: Secondary | ICD-10-CM

## 2019-11-18 DIAGNOSIS — E11319 Type 2 diabetes mellitus with unspecified diabetic retinopathy without macular edema: Secondary | ICD-10-CM

## 2019-11-18 DIAGNOSIS — E113293 Type 2 diabetes mellitus with mild nonproliferative diabetic retinopathy without macular edema, bilateral: Secondary | ICD-10-CM

## 2019-11-18 DIAGNOSIS — H43813 Vitreous degeneration, bilateral: Secondary | ICD-10-CM

## 2019-11-28 ENCOUNTER — Other Ambulatory Visit: Payer: Self-pay | Admitting: Family Medicine

## 2019-11-30 ENCOUNTER — Other Ambulatory Visit: Payer: Self-pay

## 2019-11-30 MED ORDER — METOPROLOL SUCCINATE ER 25 MG PO TB24
25.0000 mg | ORAL_TABLET | Freq: Every day | ORAL | 1 refills | Status: DC
Start: 2019-11-30 — End: 2020-07-13

## 2019-12-26 ENCOUNTER — Other Ambulatory Visit: Payer: Self-pay | Admitting: Family Medicine

## 2019-12-27 NOTE — Telephone Encounter (Signed)
Requesting: Lorazepam Contract: 11/09/19 UDS:09/22/18 Last Visit:11/09/19 Next Visit:02/14/20 Last Refill: 05/12/19(60,5)  Please Advise. Medication pending

## 2020-01-25 ENCOUNTER — Other Ambulatory Visit: Payer: Self-pay

## 2020-01-25 MED ORDER — ESCITALOPRAM OXALATE 10 MG PO TABS: ORAL_TABLET | ORAL | 1 refills | Status: DC

## 2020-02-14 ENCOUNTER — Ambulatory Visit: Payer: Federal, State, Local not specified - PPO | Admitting: Family Medicine

## 2020-02-24 ENCOUNTER — Other Ambulatory Visit: Payer: Self-pay | Admitting: Family Medicine

## 2020-03-14 ENCOUNTER — Other Ambulatory Visit: Payer: Self-pay

## 2020-03-15 ENCOUNTER — Ambulatory Visit: Payer: Federal, State, Local not specified - PPO | Admitting: Family Medicine

## 2020-03-15 ENCOUNTER — Encounter: Payer: Self-pay | Admitting: Family Medicine

## 2020-03-15 VITALS — BP 132/81 | HR 76 | Temp 97.4°F | Resp 16 | Ht 77.75 in | Wt 306.8 lb

## 2020-03-15 DIAGNOSIS — Z79899 Other long term (current) drug therapy: Secondary | ICD-10-CM

## 2020-03-15 DIAGNOSIS — F411 Generalized anxiety disorder: Secondary | ICD-10-CM | POA: Diagnosis not present

## 2020-03-15 DIAGNOSIS — I10 Essential (primary) hypertension: Secondary | ICD-10-CM

## 2020-03-15 DIAGNOSIS — E781 Pure hyperglyceridemia: Secondary | ICD-10-CM

## 2020-03-15 DIAGNOSIS — E119 Type 2 diabetes mellitus without complications: Secondary | ICD-10-CM | POA: Diagnosis not present

## 2020-03-15 LAB — BASIC METABOLIC PANEL
BUN: 16 mg/dL (ref 6–23)
CO2: 27 mEq/L (ref 19–32)
Calcium: 9.4 mg/dL (ref 8.4–10.5)
Chloride: 102 mEq/L (ref 96–112)
Creatinine, Ser: 0.69 mg/dL (ref 0.40–1.50)
GFR: 100.2 mL/min (ref >60.00)
Glucose, Bld: 182 mg/dL — ABNORMAL HIGH (ref 70–99)
Potassium: 4.2 mEq/L (ref 3.5–5.1)
Sodium: 137 mEq/L (ref 135–145)

## 2020-03-15 LAB — HEMOGLOBIN A1C: Hgb A1c MFr Bld: 8 % — ABNORMAL HIGH (ref 4.6–6.5)

## 2020-03-15 NOTE — Progress Notes (Signed)
OFFICE VISIT  03/15/2020  CC:  Chief Complaint  Patient presents with  . Follow-up    RCI, 3 mo. Pt is fasting    HPI:    Patient is a 60 y.o. Caucasian male who presents for 4 mo f/u DM, HTN, hypertriglyceridemia, and anx/dep.  HTN: home bp monitoring consistently <130/80. DM: not monitoring glucose any lately. Admits diet is poor, not exercising, too busy with lots of work.  Mood/anxiety levels stable, taking lexapro 10mg  qd and loraz bid.  Has a new granddaughter!  Traveling back and forth to [place], [place] to visit her a lot lately.  Sounds like plans to relocate there to join his wife (already bought a home) towards the end of this year.  ROS: no fevers, no CP, no SOB, no wheezing, no cough, no dizziness, no HAs, no rashes, no melena/hematochezia.  No polyuria or polydipsia.  No myalgias or arthralgias.  No focal weakness, paresthesias, or tremors.  No acute vision or hearing abnormalities. No n/v/d or abd pain.  No palpitations.     PMP AWARE reviewed today: most recent rx for lorazepam 0.5mg  was filled 02/28/20, # 4, rx by me. No red flags.  Past Medical History:  Diagnosis Date  . Arthritis of right acromioclavicular joint    steroid inj 09/2018 (Dr. Tamala Julian)  . Cataract   . Chronic venous insufficiency   . Diverticulosis 2009; 2020   Hx of 'itis.  . 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 Zigmund Daniel in [place].  Marland Kitchen GAD (generalized anxiety disorder)   . Glaucoma   . High triglycerides   . History of adenomatous polyp of colon 11/2018   Recall 11/2023.  Marland Kitchen Hypertension approx 2010  . Hypertensive retinopathy of both eyes    Dr. Zigmund Daniel  . Mood disorder ([place])    dep/anx  . Obesity, Class I, BMI 30-34.9   . Right rotator cuff tendinitis    + adhesive capsulitis.  Inj 10/2018 Dr. Verdis Frederickson improved    Past Surgical History:  Procedure Laterality Date  . ANTERIOR CRUCIATE LIGAMENT REPAIR  1985  .  COLONOSCOPY  2009. 11/30/18   2009 normal.  11/2018 adenoma.  Recall 5 yrs. +Diverticulosis.  Marland Kitchen HERNIA REPAIR     age 44 and 60 years old  . [place] SURGERY  1999  . TONSILECTOMY, ADENOIDECTOMY, BILATERAL MYRINGOTOMY AND TUBES  1966    Outpatient Medications Prior to Visit  Medication Sig Dispense Refill  . amLODipine (NORVASC) 10 MG tablet Take 1 tablet (10 mg total) by mouth daily. 90 tablet 1  . escitalopram (LEXAPRO) 10 MG tablet TAKE 1 TABLET(10 MG) BY MOUTH DAILY 90 tablet 1  . fenofibrate (TRICOR) 145 MG tablet TAKE 1 TABLET(145 MG) BY MOUTH DAILY 90 tablet 1  . Glucosamine-Chondroitin (OSTEO BI-FLEX REGULAR STRENGTH PO) Take 1 tablet by mouth daily.    Marland Kitchen glucose blood test strip Use as instructed 100 each 12  . Lancets 30G MISC Use as directed to check blood sugar 100 each 11  . LORazepam (ATIVAN) 0.5 MG tablet TAKE 1 TO 2 TABLETS BY MOUTH TWICE DAILY AS NEEDED FOR ANXIETY 60 tablet 5  . metFORMIN (GLUCOPHAGE-XR) 500 MG 24 hr tablet TAKE 2 TABLETS(1000 MG) BY MOUTH TWICE DAILY 180 tablet 1  . metoprolol succinate (TOPROL-XL) 25 MG 24 hr tablet Take 1 tablet (25 mg total) by mouth daily. 90 tablet 1  . Multiple Vitamin (MULTIVITAMIN) tablet Take 1 tablet by  mouth daily.    . Omega-3 Fatty Acids (FISH OIL PO) Take 1 capsule by mouth daily.    . pioglitazone (ACTOS) 45 MG tablet Take 1 tablet (45 mg total) by mouth daily. 90 tablet 3  . Psyllium Fiber 0.52 g CAPS Take 1 capsule by mouth daily.    Marland Kitchen telmisartan (MICARDIS) 80 MG tablet TAKE 1 TABLET(80 MG) BY MOUTH DAILY 90 tablet 1   No facility-administered medications prior to visit.    No Known Allergies  ROS As per HPI  PE: Vitals with BMI 03/15/2020 11/09/2019 08/02/2019  Height 6' 5.75" 6' 5.75" 6\' 7"   Weight 306 lbs 13 oz 298 lbs 13 oz 306 lbs  BMI 35.68 61.95 09.32  Systolic 671 245 809  Diastolic 81 77 84  Pulse 76 75 68   Gen: Alert, well appearing.  Patient is oriented to person, place, time, and  situation. AFFECT: pleasant, lucid thought and speech. CV: RRR, no m/r/g.   LUNGS: CTA bilat, nonlabored resps, good aeration in all lung fields. EXT: no clubbing or cyanosis.  3-4 + bilat LL pitting edema from mid tibia level down into ankles, with mild diffuse freckling skin changes.  No erythema or tenderness or asymmetry.    LABS:  Lab Results  Component Value Date   TSH 2.82 11/09/2019   Lab Results  Component Value Date   WBC 7.8 11/09/2019   HGB 14.3 11/09/2019   HCT 42.2 11/09/2019   MCV 90.8 11/09/2019   PLT 252.0 11/09/2019   Lab Results  Component Value Date   CREATININE 0.79 11/09/2019   BUN 18 11/09/2019   NA 138 11/09/2019   K 3.9 11/09/2019   CL 102 11/09/2019   CO2 28 11/09/2019   Lab Results  Component Value Date   ALT 22 11/09/2019   AST 15 11/09/2019   ALKPHOS 62 11/09/2019   BILITOT 0.4 11/09/2019   Lab Results  Component Value Date   CHOL 131 11/09/2019   Lab Results  Component Value Date   HDL 38.10 (L) 11/09/2019   Lab Results  Component Value Date   LDLCALC 59 11/09/2019   Lab Results  Component Value Date   TRIG 166.0 (H) 11/09/2019   Lab Results  Component Value Date   CHOLHDL 3 11/09/2019   Lab Results  Component Value Date   PSA 0.57 11/09/2019   PSA 0.69 09/22/2018   PSA 0.59 05/08/2017   Lab Results  Component Value Date   HGBA1C 7.5 (H) 11/09/2019   IMPRESSION AND PLAN:  1) DM 2, noncompliant with diet and exercise. Wt is back up to 307 lbs. Cont full dose metformin xr and actos. A1c and bmet check today.  2) HTN: good control. Cont amlod 10mg  qd, toprol xl 25 qd, and telmisartan 80mg  qd. BMET today.  3) Hypertrig: tolerating fenofibrate 145mg  qd. Lipid panel normal 3 mo ago, plan repeat flp and hepatic panel in 4 mo.  4) GAD, hx of MDD: all stable. Cont lexapro 10mg  qd and loraz 0.5 bid. CSC UTD. UDS today.  5) LE venous insufficiency edema: stable but pretty prominent. Stressed importance of low Na  intake, elevation, frequent ambulation. Discussed s/s of DVT, what to call/return for, etc.  An After Visit Summary was printed and given to the patient.  FOLLOW UP: Return in about 4 months (around 07/13/2020) for routine chronic illness f/u. Next cpe 11/2020  Signed:  Crissie Sickles, MD           03/15/2020

## 2020-03-16 ENCOUNTER — Other Ambulatory Visit: Payer: Self-pay

## 2020-03-16 MED ORDER — GLIPIZIDE ER 5 MG PO TB24: 5.0000 mg | ORAL_TABLET | Freq: Every day | ORAL | 1 refills | Status: DC

## 2020-03-18 LAB — DRUG MONITORING, PANEL 8 WITH CONFIRMATION, URINE
6 Acetylmorphine: NEGATIVE ng/mL (ref <10)
Alcohol Metabolites: NEGATIVE ng/mL
Alphahydroxyalprazolam: NEGATIVE ng/mL (ref <25)
Alphahydroxymidazolam: NEGATIVE ng/mL (ref <50)
Alphahydroxytriazolam: NEGATIVE ng/mL (ref <50)
Aminoclonazepam: NEGATIVE ng/mL (ref <25)
Amphetamines: NEGATIVE ng/mL (ref <500)
Benzodiazepines: POSITIVE ng/mL — AB (ref <100)
Buprenorphine, Urine: NEGATIVE ng/mL (ref <5)
Cocaine Metabolite: NEGATIVE ng/mL (ref <150)
Creatinine: 133.8 mg/dL
Hydroxyethylflurazepam: NEGATIVE ng/mL (ref <50)
Lorazepam: 198 ng/mL — ABNORMAL HIGH (ref <50)
MDMA: NEGATIVE ng/mL (ref <500)
Marijuana Metabolite: NEGATIVE ng/mL (ref <20)
Nordiazepam: NEGATIVE ng/mL (ref <50)
Opiates: NEGATIVE ng/mL (ref <100)
Oxazepam: NEGATIVE ng/mL (ref <50)
Oxidant: NEGATIVE ug/mL
Oxycodone: NEGATIVE ng/mL (ref <100)
Temazepam: NEGATIVE ng/mL (ref <50)
pH: 6.2 (ref 4.5–9.0)

## 2020-03-18 LAB — DM TEMPLATE

## 2020-04-17 ENCOUNTER — Other Ambulatory Visit: Payer: Self-pay

## 2020-04-17 MED ORDER — AMLODIPINE BESYLATE 10 MG PO TABS: 10.0000 mg | ORAL_TABLET | Freq: Every day | ORAL | 0 refills | Status: DC

## 2020-04-22 ENCOUNTER — Other Ambulatory Visit: Payer: Self-pay | Admitting: Family Medicine

## 2020-06-11 ENCOUNTER — Other Ambulatory Visit: Payer: Self-pay | Admitting: Family Medicine

## 2020-06-12 ENCOUNTER — Other Ambulatory Visit: Payer: Self-pay

## 2020-06-12 MED ORDER — TELMISARTAN 80 MG PO TABS: ORAL_TABLET | ORAL | 1 refills | Status: DC

## 2020-06-19 ENCOUNTER — Other Ambulatory Visit: Payer: Self-pay

## 2020-06-19 MED ORDER — TELMISARTAN 80 MG PO TABS
ORAL_TABLET | ORAL | 1 refills | Status: DC
Start: 2020-06-19 — End: 2021-02-19

## 2020-06-20 ENCOUNTER — Other Ambulatory Visit: Payer: Self-pay

## 2020-07-13 ENCOUNTER — Encounter: Payer: Self-pay | Admitting: Family Medicine

## 2020-07-13 ENCOUNTER — Other Ambulatory Visit: Payer: Self-pay

## 2020-07-13 ENCOUNTER — Ambulatory Visit: Payer: Federal, State, Local not specified - PPO | Admitting: Family Medicine

## 2020-07-13 VITALS — BP 143/80 | HR 68 | Temp 97.7°F | Resp 16 | Ht 77.75 in | Wt 297.6 lb

## 2020-07-13 DIAGNOSIS — E113299 Type 2 diabetes mellitus with mild nonproliferative diabetic retinopathy without macular edema, unspecified eye: Secondary | ICD-10-CM | POA: Diagnosis not present

## 2020-07-13 DIAGNOSIS — E118 Type 2 diabetes mellitus with unspecified complications: Secondary | ICD-10-CM

## 2020-07-13 DIAGNOSIS — I10 Essential (primary) hypertension: Secondary | ICD-10-CM | POA: Diagnosis not present

## 2020-07-13 DIAGNOSIS — E781 Pure hyperglyceridemia: Secondary | ICD-10-CM

## 2020-07-13 DIAGNOSIS — F411 Generalized anxiety disorder: Secondary | ICD-10-CM

## 2020-07-13 LAB — HEMOGLOBIN A1C: Hgb A1c MFr Bld: 7.4 % — ABNORMAL HIGH (ref 4.6–6.5)

## 2020-07-13 LAB — LDL CHOLESTEROL, DIRECT: Direct LDL: 50 mg/dL

## 2020-07-13 LAB — LIPID PANEL
Cholesterol: 107 mg/dL (ref 0–200)
HDL: 28.7 mg/dL — ABNORMAL LOW (ref >39.00)
NonHDL: 78.03
Total CHOL/HDL Ratio: 4
Triglycerides: 243 mg/dL — ABNORMAL HIGH (ref 0.0–149.0)
VLDL: 48.6 mg/dL — ABNORMAL HIGH (ref 0.0–40.0)

## 2020-07-13 LAB — COMPREHENSIVE METABOLIC PANEL
ALT: 25 U/L (ref 0–53)
AST: 18 U/L (ref 0–37)
Albumin: 4.2 g/dL (ref 3.5–5.2)
Alkaline Phosphatase: 72 U/L (ref 39–117)
BUN: 12 mg/dL (ref 6–23)
CO2: 26 mEq/L (ref 19–32)
Calcium: 8.8 mg/dL (ref 8.4–10.5)
Chloride: 103 mEq/L (ref 96–112)
Creatinine, Ser: 0.67 mg/dL (ref 0.40–1.50)
GFR: 100.86 mL/min (ref >60.00)
Glucose, Bld: 165 mg/dL — ABNORMAL HIGH (ref 70–99)
Potassium: 4.2 mEq/L (ref 3.5–5.1)
Sodium: 138 mEq/L (ref 135–145)
Total Bilirubin: 0.5 mg/dL (ref 0.2–1.2)
Total Protein: 6.8 g/dL (ref 6.0–8.3)

## 2020-07-13 MED ORDER — LORAZEPAM 0.5 MG PO TABS: ORAL_TABLET | ORAL | 5 refills | Status: DC

## 2020-07-13 MED ORDER — METOPROLOL SUCCINATE ER 25 MG PO TB24: 25.0000 mg | ORAL_TABLET | Freq: Every day | ORAL | 3 refills | Status: DC

## 2020-07-13 MED ORDER — ESCITALOPRAM OXALATE 10 MG PO TABS: ORAL_TABLET | ORAL | 3 refills | Status: DC

## 2020-07-13 MED ORDER — GLIPIZIDE ER 5 MG PO TB24: 5.0000 mg | ORAL_TABLET | Freq: Every day | ORAL | 3 refills | Status: DC

## 2020-07-13 MED ORDER — PIOGLITAZONE HCL 45 MG PO TABS: 45.0000 mg | ORAL_TABLET | Freq: Every day | ORAL | 3 refills | Status: DC

## 2020-07-13 NOTE — Progress Notes (Signed)
OFFICE VISIT  07/13/2020  CC:  Chief Complaint  Patient presents with   Follow-up    RCI, 4 mo. Pt is fasting   HPI:    Patient is a 60 y.o. Caucasian male who presents for 4 mo f/u DM, HTN, HLD, and GAD. A/P as of last visit: "1) DM 2, noncompliant with diet and exercise. Wt is back up to 307 lbs. Cont full dose metformin xr and actos. A1c and bmet check today.   2) HTN: good control. Cont amlod 10mg  qd, toprol xl 25 qd, and telmisartan 80mg  qd. BMET today.   3) Hypertrig: tolerating fenofibrate 145mg  qd. Lipid panel normal 3 mo ago, plan repeat flp and hepatic panel in 4 mo.   4) GAD, hx of MDD: all stable. Cont lexapro 10mg  qd and loraz 0.5 bid. CSC UTD. UDS today.   5) LE venous insufficiency edema: stable but pretty prominent. Stressed importance of low Na intake, elevation, frequent ambulation. Discussed s/s of DVT, what to call/return for, etc."  INTERIM HX: Very stressed, trying to last til September when he retires. New grandbaby, wife moved to [place] to be with them, he'll be joining her soon.  DM: no home glucose monitoring, diet poor, he is compliant with meds HTN: no home monitoring.  Compliant with meds. HLD: tolerating fenofibrate.  Taking lorazepam qhs, sometimes a daytime dose. PMP AWARE reviewed today: most recent rx for lorazepam 0.5mg  was filled 06/11/20, # 25, rx by me. No red flags.    Past Medical History:  Diagnosis Date   Arthritis of right acromioclavicular joint    steroid inj 09/2018 (Dr. Tamala Julian)   Cataract    Chronic venous insufficiency    Diverticulosis 2009; 2020   Hx of 'itis.   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 Zigmund Daniel in [place].   GAD (generalized anxiety disorder)    Glaucoma    High triglycerides    History of adenomatous polyp of colon 11/2018   Recall 11/2023.   Hypertension approx 2010   Hypertensive retinopathy of both eyes    Dr. Zigmund Daniel   Mood  disorder [facility])    dep/anx   Obesity, Class I, BMI 30-34.9    Right rotator cuff tendinitis    + adhesive capsulitis.  Inj 10/2018 Dr. Verdis Frederickson improved    Past Surgical History:  Procedure Laterality Date   [place]   COLONOSCOPY  2009. 11/30/18   2009 normal.  11/2018 adenoma.  Recall 5 yrs. +Diverticulosis.   HERNIA REPAIR     age 78 and 60 years old   LUMBAR DISC SURGERY  1999   TONSILECTOMY, ADENOIDECTOMY, BILATERAL [place]    Outpatient Medications Prior to Visit  Medication Sig Dispense Refill   amLODipine (NORVASC) 10 MG tablet TAKE 1 TABLET(10 MG) BY MOUTH DAILY 90 tablet 0   fenofibrate (TRICOR) 145 MG tablet TAKE 1 TABLET(145 MG) BY MOUTH DAILY 90 tablet 1   Glucosamine-Chondroitin (OSTEO BI-FLEX REGULAR STRENGTH PO) Take 1 tablet by mouth daily.     glucose blood test strip Use as instructed 100 each 12   Lancets 30G MISC Use as directed to check blood sugar 100 each 11   metFORMIN (GLUCOPHAGE-XR) 500 MG 24 hr tablet TAKE 2 TABLETS(1000 MG) BY MOUTH TWICE DAILY 180 tablet 1   Multiple Vitamin (MULTIVITAMIN) tablet Take 1 tablet by mouth daily.     Omega-3 Fatty Acids (FISH  OIL PO) Take 1 capsule by mouth daily.     Psyllium Fiber 0.52 g CAPS Take 1 capsule by mouth daily.     telmisartan (MICARDIS) 80 MG tablet TAKE 1 TABLET(80 MG) BY MOUTH DAILY 90 tablet 1   escitalopram (LEXAPRO) 10 MG tablet TAKE 1 TABLET(10 MG) BY MOUTH DAILY 90 tablet 1   glipiZIDE (GLUCOTROL XL) 5 MG 24 hr tablet Take 1 tablet (5 mg total) by mouth daily with breakfast. 90 tablet 1   LORazepam (ATIVAN) 0.5 MG tablet TAKE 1 TO 2 TABLETS BY MOUTH TWICE DAILY AS NEEDED FOR ANXIETY 60 tablet 5   metoprolol succinate (TOPROL-XL) 25 MG 24 hr tablet Take 1 tablet (25 mg total) by mouth daily. 90 tablet 1   pioglitazone (ACTOS) 45 MG tablet Take 1 tablet (45 mg total) by mouth daily. (Patient not taking: Reported on 07/13/2020) 90 tablet 3   No  facility-administered medications prior to visit.    No Known Allergies  ROS As per HPI  PE: Vitals with BMI 07/13/2020 03/15/2020 11/09/2019  Height 6' 5.75" 6' 5.75" 6' 5.75"  Weight 297 lbs 10 oz 306 lbs 13 oz 298 lbs 13 oz  BMI 34.61 83.15 17.61  Systolic 607 371 062  Diastolic 80 81 77  Pulse 68 76 75     Gen: Alert, well appearing.  Patient is oriented to person, place, time, and situation. AFFECT: pleasant, lucid thought and speech. CV: RRR, no m/r/g.   LUNGS: CTA bilat, nonlabored resps, good aeration in all lung fields. EXT: no clubbing or cyanosis.  2-3 + bilat LL pitting edema with diffuse hyperpigmentation changes.   LABS:  Lab Results  Component Value Date   TSH 2.82 11/09/2019   Lab Results  Component Value Date   WBC 7.8 11/09/2019   HGB 14.3 11/09/2019   HCT 42.2 11/09/2019   MCV 90.8 11/09/2019   PLT 252.0 11/09/2019   Lab Results  Component Value Date   CREATININE 0.69 03/15/2020   BUN 16 03/15/2020   NA 137 03/15/2020   K 4.2 03/15/2020   CL 102 03/15/2020   CO2 27 03/15/2020   Lab Results  Component Value Date   ALT 22 11/09/2019   AST 15 11/09/2019   ALKPHOS 62 11/09/2019   BILITOT 0.4 11/09/2019   Lab Results  Component Value Date   CHOL 131 11/09/2019   Lab Results  Component Value Date   HDL 38.10 (L) 11/09/2019   Lab Results  Component Value Date   LDLCALC 59 11/09/2019   Lab Results  Component Value Date   TRIG 166.0 (H) 11/09/2019   Lab Results  Component Value Date   CHOLHDL 3 11/09/2019   Lab Results  Component Value Date   PSA 0.57 11/09/2019   PSA 0.69 09/22/2018   PSA 0.59 05/08/2017   Lab Results  Component Value Date   HGBA1C 8.0 (H) 03/15/2020    IMPRESSION AND PLAN:  1) DM 2, needs to improve diet and exercise. Hba1c and lytes/cr today. Cont glipizide, pioglit (he has been out of this x 3 mo), and metformin.  2) HTN: stable on micardis 80 qd, amlodipine 10mg  qd, and toprol xl 25 qd.  3)  Hypertriglyceridemia: cont fenofibrate 145 qd. FLP and hepatic panel today.  4) GAD: doing fine considering his stressful work circumstances. Cont lexapro and lorazepam. Lorazepam 0.5mg , 1 bid prn, #60, RF x 5.  An After Visit Summary was printed and given to the patient.  FOLLOW UP: Return  in about 3 months (around 10/13/2020). Next cpe 11/2020  Signed:  Crissie Sickles, MD           07/13/2020

## 2020-09-11 ENCOUNTER — Other Ambulatory Visit: Payer: Self-pay

## 2020-09-11 MED ORDER — METFORMIN HCL ER 500 MG PO TB24: ORAL_TABLET | ORAL | 0 refills | Status: DC

## 2020-10-01 ENCOUNTER — Other Ambulatory Visit: Payer: Self-pay | Admitting: Family Medicine

## 2020-10-17 ENCOUNTER — Other Ambulatory Visit: Payer: Self-pay

## 2020-10-17 ENCOUNTER — Encounter: Payer: Self-pay | Admitting: Family Medicine

## 2020-10-17 ENCOUNTER — Ambulatory Visit: Payer: Federal, State, Local not specified - PPO | Admitting: Family Medicine

## 2020-10-17 VITALS — BP 131/78 | HR 70 | Temp 97.7°F | Resp 16 | Ht 77.75 in | Wt 299.0 lb

## 2020-10-17 DIAGNOSIS — E11319 Type 2 diabetes mellitus with unspecified diabetic retinopathy without macular edema: Secondary | ICD-10-CM

## 2020-10-17 DIAGNOSIS — E781 Pure hyperglyceridemia: Secondary | ICD-10-CM

## 2020-10-17 DIAGNOSIS — I10 Essential (primary) hypertension: Secondary | ICD-10-CM | POA: Diagnosis not present

## 2020-10-17 LAB — COMPREHENSIVE METABOLIC PANEL
ALT: 20 U/L (ref 0–53)
AST: 12 U/L (ref 0–37)
Albumin: 4.1 g/dL (ref 3.5–5.2)
Alkaline Phosphatase: 58 U/L (ref 39–117)
BUN: 14 mg/dL (ref 6–23)
CO2: 27 mEq/L (ref 19–32)
Calcium: 9.4 mg/dL (ref 8.4–10.5)
Chloride: 103 mEq/L (ref 96–112)
Creatinine, Ser: 0.72 mg/dL (ref 0.40–1.50)
GFR: 98.51 mL/min (ref >60.00)
Glucose, Bld: 130 mg/dL — ABNORMAL HIGH (ref 70–99)
Potassium: 4.2 mEq/L (ref 3.5–5.1)
Sodium: 138 mEq/L (ref 135–145)
Total Bilirubin: 0.5 mg/dL (ref 0.2–1.2)
Total Protein: 6.9 g/dL (ref 6.0–8.3)

## 2020-10-17 LAB — LIPID PANEL
Cholesterol: 126 mg/dL (ref 0–200)
HDL: 38.7 mg/dL — ABNORMAL LOW (ref >39.00)
LDL Cholesterol: 52 mg/dL (ref 0–99)
NonHDL: 87.08
Total CHOL/HDL Ratio: 3
Triglycerides: 175 mg/dL — ABNORMAL HIGH (ref 0.0–149.0)
VLDL: 35 mg/dL (ref 0.0–40.0)

## 2020-10-17 LAB — HEMOGLOBIN A1C: Hgb A1c MFr Bld: 6.9 % — ABNORMAL HIGH (ref 4.6–6.5)

## 2020-10-17 NOTE — Progress Notes (Signed)
OFFICE VISIT  10/17/2020  CC:  Chief Complaint  Patient presents with   Follow-up    RCI, pt is fasting   HPI:    Patient is a 60 y.o. Caucasian male who presents for 3 mo f/u DM 2, HTN, hypertriglyceridemia. A/P as of last visit: "1) DM 2, needs to improve diet and exercise. Hba1c and lytes/cr today. Cont glipizide, pioglit (he has been out of this x 3 mo), and metformin.   2) HTN: stable on micardis 80 qd, amlodipine '10mg'$  qd, and toprol xl 25 qd.   3) Hypertriglyceridemia: cont fenofibrate 145 qd. FLP and hepatic panel today.   4) GAD: doing fine considering his stressful work circumstances. Cont lexapro and lorazepam. Lorazepam 0.'5mg'$ , 1 bid prn, #60, RF x 5."  INTERIM HX: Feeling well, moving to [place] in < 1 wk. Compliant with all meds. No home glucose or bp monitoring.  ROS --> no fevers, no CP, no SOB, no wheezing, no cough, no dizziness, no HAs, no rashes, no melena/hematochezia.  No polyuria or polydipsia.  No myalgias or arthralgias.  No focal weakness, paresthesias, or tremors.  No acute vision or hearing abnormalities.  No dysuria or unusual/new urinary urgency or frequency.  No recent changes in lower legs. No n/v/d or abd pain.  No palpitations.     Past Medical History:  Diagnosis Date   Arthritis of right acromioclavicular joint    steroid inj 09/2018 (Dr. Tamala Julian)   Cataract    Chronic venous insufficiency    Diverticulosis 2009; 2020   Hx of 'itis.   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 Zigmund Daniel in [place].   GAD (generalized anxiety disorder)    Glaucoma    High triglycerides    History of adenomatous polyp of colon 11/2018   Recall 11/2023.   Hypertension approx 2010   Hypertensive retinopathy of both eyes    Dr. Zigmund Daniel   Mood disorder [facility])    dep/anx   Obesity, Class I, BMI 30-34.9    Right rotator cuff tendinitis    + adhesive capsulitis.  Inj 10/2018 Dr. Verdis Frederickson  improved    Past Surgical History:  Procedure Laterality Date   [place]   COLONOSCOPY  2009. 11/30/18   2009 normal.  11/2018 adenoma.  Recall 5 yrs. +Diverticulosis.   HERNIA REPAIR     age 29 and 60 years old   LUMBAR DISC SURGERY  1999   TONSILECTOMY, ADENOIDECTOMY, BILATERAL [place]    Outpatient Medications Prior to Visit  Medication Sig Dispense Refill   amLODipine (NORVASC) 10 MG tablet TAKE 1 TABLET(10 MG) BY MOUTH DAILY 90 tablet 0   escitalopram (LEXAPRO) 10 MG tablet TAKE 1 TABLET(10 MG) BY MOUTH DAILY 90 tablet 3   fenofibrate (TRICOR) 145 MG tablet TAKE 1 TABLET(145 MG) BY MOUTH DAILY 90 tablet 1   glipiZIDE (GLUCOTROL XL) 5 MG 24 hr tablet Take 1 tablet (5 mg total) by mouth daily with breakfast. 90 tablet 3   Glucosamine-Chondroitin (OSTEO BI-FLEX REGULAR STRENGTH PO) Take 1 tablet by mouth daily.     glucose blood test strip Use as instructed 100 each 12   Lancets 30G MISC Use as directed to check blood sugar 100 each 11   LORazepam (ATIVAN) 0.5 MG tablet 1 tab po bid prn 60 tablet 5   metFORMIN (GLUCOPHAGE-XR) 500 MG 24 hr tablet TAKE 2 TABLETS(1000 MG) BY MOUTH  TWICE DAILY 60 tablet 0   metoprolol succinate (TOPROL-XL) 25 MG 24 hr tablet Take 1 tablet (25 mg total) by mouth daily. 90 tablet 3   Multiple Vitamin (MULTIVITAMIN) tablet Take 1 tablet by mouth daily.     Omega-3 Fatty Acids (FISH OIL PO) Take 1 capsule by mouth daily.     pioglitazone (ACTOS) 45 MG tablet Take 1 tablet (45 mg total) by mouth daily. 90 tablet 3   Psyllium Fiber 0.52 g CAPS Take 1 capsule by mouth daily.     telmisartan (MICARDIS) 80 MG tablet TAKE 1 TABLET(80 MG) BY MOUTH DAILY 90 tablet 1   No facility-administered medications prior to visit.    No Known Allergies  ROS As per HPI  PE: Vitals with BMI 10/17/2020 07/13/2020 03/15/2020  Height 6' 5.75" 6' 5.75" 6' 5.75"  Weight 299 lbs 297 lbs 10 oz 306 lbs 13 oz  BMI 34.77 [id number]  0000000  Systolic [id number] [id number]  Diastolic 78 80 81  Pulse 70 68 76    Gen: Alert, well appearing.  Patient is oriented to person, place, time, and situation. AFFECT: pleasant, lucid thought and speech. CV: RRR, no m/r/g.   LUNGS: CTA bilat, nonlabored resps, good aeration in all lung fields. EXT: no clubbing or cyanosis.  2+ bilat LL pitting edema.    LABS:  Lab Results  Component Value Date   TSH 2.82 11/09/2019   Lab Results  Component Value Date   WBC 7.8 11/09/2019   HGB 14.3 11/09/2019   HCT 42.2 11/09/2019   MCV 90.8 11/09/2019   PLT 252.0 11/09/2019   Lab Results  Component Value Date   CREATININE 0.67 07/13/2020   BUN 12 07/13/2020   NA 138 07/13/2020   K 4.2 07/13/2020   CL 103 07/13/2020   CO2 26 07/13/2020   Lab Results  Component Value Date   ALT 25 07/13/2020   AST 18 07/13/2020   ALKPHOS 72 07/13/2020   BILITOT 0.5 07/13/2020   Lab Results  Component Value Date   CHOL 107 07/13/2020   Lab Results  Component Value Date   HDL 28.70 (L) 07/13/2020   Lab Results  Component Value Date   LDLCALC 59 11/09/2019   Lab Results  Component Value Date   TRIG 243.0 (H) 07/13/2020   Lab Results  Component Value Date   CHOLHDL 4 07/13/2020   Lab Results  Component Value Date   PSA 0.57 11/09/2019   PSA 0.69 09/22/2018   PSA 0.59 05/08/2017   Lab Results  Component Value Date   HGBA1C 7.4 (H) 07/13/2020   IMPRESSION AND PLAN:  1) DM 2. Cont metformin 1000 bid, glipizide 5 qd, and actos 45 qd. Hba1c and bmet today.  2) HTN: good control on telmisartan 80 bid, amlodipine 10 qd, and toprol xl 25 qd. Lytes/cr today.  3) Hypertriglyceridemia: fenofibrate 145 qd. FLP and hepatic panel today.  An After Visit Summary was printed and given to the patient.  FOLLOW UP: Return for no f/u appt needed (he is relocating to [place]).  Signed:  Crissie Sickles, MD           10/17/2020

## 2020-11-06 ENCOUNTER — Other Ambulatory Visit: Payer: Self-pay

## 2020-11-06 ENCOUNTER — Telehealth: Payer: Self-pay

## 2020-11-06 MED ORDER — AMLODIPINE BESYLATE 10 MG PO TABS: ORAL_TABLET | ORAL | 0 refills | Status: DC

## 2020-11-06 NOTE — Telephone Encounter (Signed)
90 d supply sent to preferred pharmacy on file, [place]. Pt was made aware

## 2020-11-06 NOTE — Telephone Encounter (Signed)
Patient last seen on 9/13 by Dr. Anitra Lauth.  Patient stated that he made Dr. Anitra Lauth aware and his assistant aware that he will be relocated to [place].  Patient stated he will be working up in [place] a couple times a month until December. Patient stated that [facility] has been sending requests for approval, but now he is in [place].  [facility] in [place] will be new preferred pharmacy. [phone]  amLODipine (NORVASC) 10 MG tablet [[id number]]

## 2020-11-16 ENCOUNTER — Encounter (INDEPENDENT_AMBULATORY_CARE_PROVIDER_SITE_OTHER): Payer: Federal, State, Local not specified - PPO | Admitting: Ophthalmology

## 2020-11-21 ENCOUNTER — Other Ambulatory Visit: Payer: Self-pay

## 2020-11-21 MED ORDER — METFORMIN HCL ER 500 MG PO TB24: ORAL_TABLET | ORAL | 0 refills | Status: DC

## 2020-11-27 ENCOUNTER — Other Ambulatory Visit: Payer: Self-pay

## 2020-11-27 MED ORDER — FENOFIBRATE 145 MG PO TABS: ORAL_TABLET | ORAL | 0 refills | Status: DC

## 2021-01-25 ENCOUNTER — Other Ambulatory Visit: Payer: Self-pay | Admitting: Family Medicine

## 2021-01-25 NOTE — Telephone Encounter (Signed)
Pls explain to pt that [place] medical board rules state that a doctor cannot send a rx to an out of state pharmacy.Sorry

## 2021-01-25 NOTE — Telephone Encounter (Signed)
LM for pt to return call regarding med refill.

## 2021-01-25 NOTE — Telephone Encounter (Signed)
Requesting: Lorazepam Contract: 11/09/19 UDS: 03/15/20 Last Visit: 10/17/20 Next Visit: n/a, moved to [place] Last Refill: 07/13/20(60,5)  Please Advise. Med pending

## 2021-02-09 ENCOUNTER — Other Ambulatory Visit: Payer: Self-pay | Admitting: Family Medicine

## 2021-02-19 ENCOUNTER — Telehealth: Payer: Self-pay

## 2021-02-19 MED ORDER — FENOFIBRATE 145 MG PO TABS: ORAL_TABLET | ORAL | 0 refills | Status: AC

## 2021-02-19 MED ORDER — GLIPIZIDE ER 5 MG PO TB24: 5.0000 mg | ORAL_TABLET | Freq: Every day | ORAL | 0 refills | Status: AC

## 2021-02-19 MED ORDER — METOPROLOL SUCCINATE ER 25 MG PO TB24: 25.0000 mg | ORAL_TABLET | Freq: Every day | ORAL | 0 refills | Status: AC

## 2021-02-19 MED ORDER — TELMISARTAN 80 MG PO TABS: ORAL_TABLET | ORAL | 0 refills | Status: AC

## 2021-02-19 MED ORDER — AMLODIPINE BESYLATE 10 MG PO TABS: ORAL_TABLET | ORAL | 0 refills | Status: AC

## 2021-02-19 MED ORDER — LORAZEPAM 0.5 MG PO TABS: ORAL_TABLET | ORAL | 2 refills | Status: AC

## 2021-02-19 MED ORDER — ESCITALOPRAM OXALATE 10 MG PO TABS: ORAL_TABLET | ORAL | 0 refills | Status: AC

## 2021-02-19 MED ORDER — METFORMIN HCL ER 500 MG PO TB24: ORAL_TABLET | ORAL | 0 refills | Status: AC

## 2021-02-19 MED ORDER — PIOGLITAZONE HCL 45 MG PO TABS: 45.0000 mg | ORAL_TABLET | Freq: Every day | ORAL | 0 refills | Status: AC

## 2021-02-19 NOTE — Telephone Encounter (Signed)
Patient has now transferred to [place], however, he has not set up with a new PCP yet.  The earliest he could get an appt with new PCP is 04/26/2021.   Patient sent med list with all meds that he is currently taking prescribed by Dr. Anitra Lauth (9 total),current dosage, total of pills he currently has left in each bottle, and the # of pills he will need to get him thru to his appt on 3/23.   I tried to attach form to this note but I was not able to.  Please see below, copied and pasted from document   Patient states he is out of blood pressure meds.           Dosage  Days  Total Pills  Minimum     per day   needed   needed   remaining   needed    Metformin 500 mg 4 73 292 112 180   Amlodipine 100 mg 1 73 73 1 72   Fenofibrate 145 mg 1 73 73 50 23   Metopropol 25 mg 1 73 73 55 18   Escitalopram 10 mg 1 73 73 73 0   Lorazapam .5 mg 1-2 73 73 51 22   Pioglitazone 45 mg 1 73 73 70 3   Glipizide 5 mg 1 73 73 43 30   Telmisartan 80 mg 1 73 73 58 15                   Note: Days needed equals days to appointment + 3 day cushion           Dr. Rogers Blocker:   [phone]      Appointment:        Date 04/26/2021       Time 2:30               Use Pharmacy below:       [facility]        [place], [place] [address]        [phone]

## 2021-02-19 NOTE — Telephone Encounter (Signed)
Alls meds pending, pharmacy updated

## 2021-02-20 NOTE — Telephone Encounter (Signed)
Pt called about status of med refill for medications below  metoprolol succinate metoprolol succinate (TOPROL-XL) 25 MG 24 hr tablet  escitalopram escitalopram (LEXAPRO) 10 MG tablet  telmisartan telmisartan (MICARDIS) 80 MG tablet  glipiZIDE glipiZIDE (GLUCOTROL XL) 5 MG 24 hr tablet   Informed pt that meds are pending.--KR Pt cell: [phone]   Use Pharmacy below:            [facility]             [place], [place] [address]              [phone]

## 2021-02-20 NOTE — Telephone Encounter (Signed)
Pt advised refills sent. °

## 2021-02-20 NOTE — Telephone Encounter (Signed)
LM for pt to return call regarding refills.  

## 2021-10-17 ENCOUNTER — Other Ambulatory Visit: Payer: Self-pay
